# Patient Record
Sex: Female | Born: 1950
Health system: Southern US, Community
[De-identification: ages and names within clinical notes are randomized; demographics above are authoritative.]

## PROBLEM LIST (undated history)

## (undated) DIAGNOSIS — Z5189 Encounter for other specified aftercare: Secondary | ICD-10-CM

## (undated) DIAGNOSIS — B019 Varicella without complication: Secondary | ICD-10-CM

## (undated) DIAGNOSIS — K219 Gastro-esophageal reflux disease without esophagitis: Secondary | ICD-10-CM

## (undated) DIAGNOSIS — M810 Age-related osteoporosis without current pathological fracture: Secondary | ICD-10-CM

## (undated) DIAGNOSIS — Z8709 Personal history of other diseases of the respiratory system: Secondary | ICD-10-CM

## (undated) DIAGNOSIS — D649 Anemia, unspecified: Secondary | ICD-10-CM

## (undated) DIAGNOSIS — N390 Urinary tract infection, site not specified: Secondary | ICD-10-CM

## (undated) DIAGNOSIS — H9319 Tinnitus, unspecified ear: Secondary | ICD-10-CM

## (undated) DIAGNOSIS — M549 Dorsalgia, unspecified: Secondary | ICD-10-CM

## (undated) DIAGNOSIS — E039 Hypothyroidism, unspecified: Secondary | ICD-10-CM

## (undated) DIAGNOSIS — Z8711 Personal history of peptic ulcer disease: Secondary | ICD-10-CM

## (undated) DIAGNOSIS — G629 Polyneuropathy, unspecified: Secondary | ICD-10-CM

## (undated) DIAGNOSIS — M1990 Unspecified osteoarthritis, unspecified site: Secondary | ICD-10-CM

## (undated) DIAGNOSIS — G8929 Other chronic pain: Secondary | ICD-10-CM

## (undated) DIAGNOSIS — F329 Major depressive disorder, single episode, unspecified: Secondary | ICD-10-CM

## (undated) DIAGNOSIS — F411 Generalized anxiety disorder: Secondary | ICD-10-CM

## (undated) DIAGNOSIS — H409 Unspecified glaucoma: Secondary | ICD-10-CM

## (undated) HISTORY — DX: Dorsalgia, unspecified: M54.9

## (undated) HISTORY — DX: Personal history of other diseases of the respiratory system: Z87.09

## (undated) HISTORY — DX: Urinary tract infection, site not specified: N39.0

## (undated) HISTORY — DX: Tinnitus, unspecified ear: H93.19

## (undated) HISTORY — DX: Unspecified glaucoma: H40.9

## (undated) HISTORY — PX: OTHER SURGICAL HISTORY: SHX169

## (undated) HISTORY — DX: Unspecified osteoarthritis, unspecified site: M19.90

## (undated) HISTORY — DX: Major depressive disorder, single episode, unspecified: F32.9

## (undated) HISTORY — DX: Other chronic pain: G89.29

## (undated) HISTORY — DX: Varicella without complication: B01.9

## (undated) HISTORY — DX: Age-related osteoporosis without current pathological fracture: M81.0

## (undated) HISTORY — DX: Anemia, unspecified: D64.9

## (undated) HISTORY — DX: Personal history of peptic ulcer disease: Z87.11

## (undated) HISTORY — DX: Polyneuropathy, unspecified: G62.9

## (undated) HISTORY — DX: Generalized anxiety disorder: F41.1

## (undated) HISTORY — DX: Encounter for other specified aftercare: Z51.89

## (undated) HISTORY — DX: Hypothyroidism, unspecified: E03.9

## (undated) HISTORY — DX: Gastro-esophageal reflux disease without esophagitis: K21.9

---
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
## 1981-11-04 HISTORY — PX: ABDOMINAL HYSTERECTOMY: SHX81

## 1982-11-04 HISTORY — PX: OTHER SURGICAL HISTORY: SHX169

## 1994-11-05 LAB — HM COLONOSCOPY: HM Colonoscopy: NORMAL

## 1999-12-28 ENCOUNTER — Other Ambulatory Visit: Admission: RE | Admit: 1999-12-28 | Discharge: 1999-12-28 | Payer: Self-pay | Admitting: Obstetrics and Gynecology

## 2004-06-13 ENCOUNTER — Observation Stay (HOSPITAL_COMMUNITY): Admission: EM | Admit: 2004-06-13 | Discharge: 2004-06-14 | Payer: Self-pay | Admitting: Emergency Medicine

## 2004-10-02 ENCOUNTER — Ambulatory Visit: Payer: Self-pay | Admitting: Family Medicine

## 2004-11-04 HISTORY — PX: COLONOSCOPY: SHX174

## 2004-11-07 ENCOUNTER — Ambulatory Visit: Payer: Self-pay | Admitting: Family Medicine

## 2004-11-28 ENCOUNTER — Ambulatory Visit: Payer: Self-pay | Admitting: Family Medicine

## 2005-01-30 ENCOUNTER — Ambulatory Visit: Payer: Self-pay | Admitting: Family Medicine

## 2005-03-27 ENCOUNTER — Ambulatory Visit: Payer: Self-pay | Admitting: Family Medicine

## 2005-04-30 ENCOUNTER — Ambulatory Visit: Payer: Self-pay | Admitting: Family Medicine

## 2005-06-04 ENCOUNTER — Ambulatory Visit: Payer: Self-pay | Admitting: Family Medicine

## 2005-07-30 ENCOUNTER — Ambulatory Visit: Payer: Self-pay | Admitting: Family Medicine

## 2005-11-13 ENCOUNTER — Ambulatory Visit: Payer: Self-pay | Admitting: Family Medicine

## 2006-05-12 ENCOUNTER — Ambulatory Visit: Payer: Self-pay | Admitting: Family Medicine

## 2006-05-15 ENCOUNTER — Encounter (INDEPENDENT_AMBULATORY_CARE_PROVIDER_SITE_OTHER): Payer: Self-pay | Admitting: *Deleted

## 2006-05-15 ENCOUNTER — Ambulatory Visit: Payer: Self-pay | Admitting: Family Medicine

## 2006-06-05 ENCOUNTER — Emergency Department (HOSPITAL_COMMUNITY): Admission: EM | Admit: 2006-06-05 | Discharge: 2006-06-05 | Payer: Self-pay | Admitting: Emergency Medicine

## 2006-07-16 ENCOUNTER — Ambulatory Visit: Payer: Self-pay | Admitting: Family Medicine

## 2006-10-07 ENCOUNTER — Other Ambulatory Visit: Admission: RE | Admit: 2006-10-07 | Discharge: 2006-10-07 | Payer: Self-pay | Admitting: Family Medicine

## 2006-10-07 ENCOUNTER — Ambulatory Visit: Payer: Self-pay | Admitting: Family Medicine

## 2006-10-07 ENCOUNTER — Encounter: Payer: Self-pay | Admitting: Family Medicine

## 2006-10-07 LAB — CONVERTED CEMR LAB
Basophils Absolute: 0 10*3/uL (ref 0.0–0.1)
HCT: 42.8 % (ref 36.0–46.0)
MCHC: 33.4 g/dL (ref 30.0–36.0)
MCV: 96.6 fL (ref 78.0–100.0)
Neutrophils Relative %: 64.2 % (ref 43.0–77.0)
Platelets: 258 10*3/uL (ref 150–400)
RBC: 4.43 M/uL (ref 3.87–5.11)
RDW: 11.6 % (ref 11.5–14.6)
TSH: 0.74 microintl units/mL (ref 0.35–5.50)

## 2007-03-03 ENCOUNTER — Ambulatory Visit: Payer: Self-pay | Admitting: Family Medicine

## 2007-03-03 LAB — CONVERTED CEMR LAB
Alkaline Phosphatase: 98 units/L (ref 39–117)
BUN: 7 mg/dL (ref 6–23)
Basophils Relative: 0.2 % (ref 0.0–1.0)
Bilirubin, Direct: 0.1 mg/dL (ref 0.0–0.3)
CO2: 29 meq/L (ref 19–32)
Creatinine, Ser: 0.9 mg/dL (ref 0.4–1.2)
Eosinophils Relative: 1.7 % (ref 0.0–5.0)
GFR calc Af Amer: 83 mL/min
Glucose, Bld: 109 mg/dL — ABNORMAL HIGH (ref 70–99)
HCT: 45.7 % (ref 36.0–46.0)
Hemoglobin: 15.4 g/dL — ABNORMAL HIGH (ref 12.0–15.0)
Lymphocytes Relative: 27.5 % (ref 12.0–46.0)
Monocytes Absolute: 0.6 10*3/uL (ref 0.2–0.7)
Neutro Abs: 3.6 10*3/uL (ref 1.4–7.7)
Neutrophils Relative %: 60.6 % (ref 43.0–77.0)
Potassium: 3.6 meq/L (ref 3.5–5.1)
RDW: 12.2 % (ref 11.5–14.6)
Sodium: 142 meq/L (ref 135–145)
TSH: 0.02 microintl units/mL — ABNORMAL LOW (ref 0.35–5.50)
Total Bilirubin: 0.8 mg/dL (ref 0.3–1.2)
Total Protein: 7.5 g/dL (ref 6.0–8.3)
WBC: 5.9 10*3/uL (ref 4.5–10.5)

## 2007-03-04 ENCOUNTER — Encounter: Payer: Self-pay | Admitting: Family Medicine

## 2007-03-04 DIAGNOSIS — M1990 Unspecified osteoarthritis, unspecified site: Secondary | ICD-10-CM

## 2007-03-04 DIAGNOSIS — F411 Generalized anxiety disorder: Secondary | ICD-10-CM

## 2007-03-04 DIAGNOSIS — Z8709 Personal history of other diseases of the respiratory system: Secondary | ICD-10-CM

## 2007-03-04 DIAGNOSIS — R269 Unspecified abnormalities of gait and mobility: Secondary | ICD-10-CM

## 2007-03-04 DIAGNOSIS — F604 Histrionic personality disorder: Secondary | ICD-10-CM | POA: Insufficient documentation

## 2007-03-04 DIAGNOSIS — Z8711 Personal history of peptic ulcer disease: Secondary | ICD-10-CM

## 2007-03-04 DIAGNOSIS — R4589 Other symptoms and signs involving emotional state: Secondary | ICD-10-CM | POA: Insufficient documentation

## 2007-03-04 DIAGNOSIS — F32A Depression, unspecified: Secondary | ICD-10-CM

## 2007-03-04 DIAGNOSIS — F329 Major depressive disorder, single episode, unspecified: Secondary | ICD-10-CM

## 2007-03-04 DIAGNOSIS — G4700 Insomnia, unspecified: Secondary | ICD-10-CM

## 2007-03-04 HISTORY — DX: Generalized anxiety disorder: F41.1

## 2007-03-04 HISTORY — DX: Depression, unspecified: F32.A

## 2007-03-04 HISTORY — DX: Personal history of other diseases of the respiratory system: Z87.09

## 2007-03-04 HISTORY — DX: Personal history of peptic ulcer disease: Z87.11

## 2007-06-01 ENCOUNTER — Encounter: Payer: Self-pay | Admitting: Family Medicine

## 2007-07-14 ENCOUNTER — Telehealth: Payer: Self-pay | Admitting: Family Medicine

## 2007-08-12 ENCOUNTER — Telehealth (INDEPENDENT_AMBULATORY_CARE_PROVIDER_SITE_OTHER): Payer: Self-pay | Admitting: *Deleted

## 2007-08-31 ENCOUNTER — Encounter: Payer: Self-pay | Admitting: Family Medicine

## 2007-09-22 ENCOUNTER — Telehealth: Payer: Self-pay | Admitting: Family Medicine

## 2007-10-12 ENCOUNTER — Ambulatory Visit: Payer: Self-pay | Admitting: Family Medicine

## 2007-10-12 ENCOUNTER — Telehealth (INDEPENDENT_AMBULATORY_CARE_PROVIDER_SITE_OTHER): Payer: Self-pay | Admitting: *Deleted

## 2007-10-12 DIAGNOSIS — E7800 Pure hypercholesterolemia, unspecified: Secondary | ICD-10-CM

## 2007-10-20 ENCOUNTER — Ambulatory Visit: Payer: Self-pay | Admitting: Family Medicine

## 2007-10-20 DIAGNOSIS — G8929 Other chronic pain: Secondary | ICD-10-CM | POA: Insufficient documentation

## 2007-10-20 DIAGNOSIS — E039 Hypothyroidism, unspecified: Secondary | ICD-10-CM | POA: Insufficient documentation

## 2007-10-20 DIAGNOSIS — H1045 Other chronic allergic conjunctivitis: Secondary | ICD-10-CM

## 2007-10-20 DIAGNOSIS — M549 Dorsalgia, unspecified: Secondary | ICD-10-CM

## 2007-11-22 LAB — CONVERTED CEMR LAB
BUN: 9 mg/dL (ref 6–23)
CO2: 30 meq/L (ref 19–32)
Calcium: 8.9 mg/dL (ref 8.4–10.5)
Chloride: 102 meq/L (ref 96–112)
GFR calc Af Amer: 111 mL/min
GFR calc non Af Amer: 91 mL/min
TSH: 0.26 microintl units/mL — ABNORMAL LOW (ref 0.35–5.50)
Total CHOL/HDL Ratio: 4.7
Triglycerides: 209 mg/dL (ref 0–149)

## 2007-12-23 ENCOUNTER — Telehealth: Payer: Self-pay | Admitting: Family Medicine

## 2008-01-20 ENCOUNTER — Ambulatory Visit: Payer: Self-pay | Admitting: Family Medicine

## 2008-01-20 DIAGNOSIS — J209 Acute bronchitis, unspecified: Secondary | ICD-10-CM | POA: Insufficient documentation

## 2008-01-20 DIAGNOSIS — E785 Hyperlipidemia, unspecified: Secondary | ICD-10-CM

## 2008-01-20 DIAGNOSIS — D649 Anemia, unspecified: Secondary | ICD-10-CM | POA: Insufficient documentation

## 2008-01-20 HISTORY — DX: Anemia, unspecified: D64.9

## 2008-01-26 LAB — CONVERTED CEMR LAB
Albumin: 3.8 g/dL (ref 3.5–5.2)
Basophils Absolute: 0 10*3/uL (ref 0.0–0.1)
Cholesterol: 199 mg/dL (ref 0–200)
Eosinophils Absolute: 0.1 10*3/uL (ref 0.0–0.6)
GFR calc Af Amer: 111 mL/min
GFR calc non Af Amer: 91 mL/min
HCT: 41.1 % (ref 36.0–46.0)
HDL: 43.1 mg/dL (ref >39.0)
Hemoglobin: 13.7 g/dL (ref 12.0–15.0)
MCHC: 33.3 g/dL (ref 30.0–36.0)
MCV: 92.1 fL (ref 78.0–100.0)
Monocytes Absolute: 0.5 10*3/uL (ref 0.2–0.7)
Neutro Abs: 4.2 10*3/uL (ref 1.4–7.7)
Neutrophils Relative %: 65.2 % (ref 43.0–77.0)
Potassium: 4.5 meq/L (ref 3.5–5.1)
Sodium: 142 meq/L (ref 135–145)
TSH: 0.07 microintl units/mL — ABNORMAL LOW (ref 0.35–5.50)
Total Bilirubin: 0.8 mg/dL (ref 0.3–1.2)

## 2008-01-27 ENCOUNTER — Telehealth: Payer: Self-pay | Admitting: Family Medicine

## 2008-04-22 ENCOUNTER — Telehealth: Payer: Self-pay | Admitting: Family Medicine

## 2008-04-29 ENCOUNTER — Telehealth: Payer: Self-pay | Admitting: Family Medicine

## 2008-04-29 ENCOUNTER — Ambulatory Visit: Payer: Self-pay | Admitting: Family Medicine

## 2008-04-29 DIAGNOSIS — R5383 Other fatigue: Secondary | ICD-10-CM

## 2008-04-29 DIAGNOSIS — R5381 Other malaise: Secondary | ICD-10-CM

## 2008-05-11 ENCOUNTER — Encounter: Payer: Self-pay | Admitting: Family Medicine

## 2008-10-26 ENCOUNTER — Ambulatory Visit: Payer: Self-pay | Admitting: Family Medicine

## 2008-10-26 DIAGNOSIS — N951 Menopausal and female climacteric states: Secondary | ICD-10-CM

## 2008-10-30 LAB — CONVERTED CEMR LAB: TSH: 0.15 microintl units/mL — ABNORMAL LOW (ref 0.35–5.50)

## 2009-01-17 ENCOUNTER — Telehealth: Payer: Self-pay | Admitting: Family Medicine

## 2009-03-01 ENCOUNTER — Telehealth: Payer: Self-pay | Admitting: Family Medicine

## 2009-11-04 HISTORY — PX: CHOLECYSTECTOMY: SHX55

## 2010-09-26 LAB — PULMONARY FUNCTION TEST

## 2011-03-22 NOTE — H&P (Signed)
Angel Ryan, Angel Ryan                          ACCOUNT NO.:  [id number]   MEDICAL RECORD NO.:  [id number]                   PATIENT TYPE:  EMS   LOCATION:  ED                                   FACILITY:  [facility]   PHYSICIAN:  Tera Mater. Clent Ridges, M.D. [facility]            DATE OF BIRTH:  1951-03-05   DATE OF ADMISSION:  06/13/2004  DATE OF DISCHARGE:                                HISTORY & PHYSICAL   CHIEF COMPLAINT:  This is a 60 year old woman with sudden onset of mental  status changes alternating between agitation and somnolence after taking her  first dose of Cymbalta earlier this morning.   HISTORY OF PRESENT ILLNESS:  The patient has a long history of depression  and anxiety.  She had been on Zoloft for quite a while and had been up to a  dose of 150 mg per day.  She tolerated it quite well, but it was felt that a  switch to an agent like Cymbalta may help better with her chronic pain.  She  does have chronic severe lower back and left hip pain.  She is status post a  total left hip replacement, but continues to have severe pain in this area.  She had been seen by neurosurgery who felt they had nothing to offer.  I  believe she has an appointment to see Dr. Ollen Gross about it coming up  some time soon.  She has been on chronic daily narcotics for some time.  At  any event, she was slowly tapered down on Zoloft over a period of about six  weeks, until she took her final  25 mg tablet of Zoloft yesterday.  This morning, she took her first 30 mg  tablet of Cymbalta about 9 a.m.  About two hours later, she had the fairly  rapid onset of somnolence, of feeling dizzy or lightheaded, feeling shaky  and feeling chilled.  This progressively got worse.  Her husband was able to  transport her in the family car to our office when they were brought back  from the front urgently.  She has been on no other new medications.  Of  note, she was seen in the clinic on Mar 08, 2004, with complaints of  some  night-time urinary incontinence.  She had no pain at that time, no burning,  no fever, etc.  An urine culture was obtained that we were able to retrieve  from the computer this afternoon.  This was positive for growing E. coli  with a list of sensitivities that is included in the chart.   PAST MEDICAL HISTORY:  Besides what is listed above includes:  1. GE reflux disease.  2. Status post a L5-S1 laminectomy and microdiskectomy.   CURRENT MEDICATIONS:  1. Nexium 40 mg daily.  2. Alprazolam 1 mg t.i.d. or q.i.d.  3. Vicodin 10/325 mg four to six times per day.  4. Parafon  Forte t.i.d. p.r.n.  5. Tylenol p.r.n.  6. She was in the midst of changing from Zoloft to Cymbalta as listed above.   ALLERGIES:  None known until her reaction today.   SOCIAL HISTORY:  She is married.  She does not work.   FAMILY HISTORY:  Noncontributory.   HABITS:  She does not use tobacco or alcohol.   PHYSICAL EXAMINATION:  VITAL SIGNS:  Temperature axillary is 97, pulse  varied from 72 to 120 and was regular, respirations varied from 14 to 30 and  were non labored, although at times she was clearly hyperventilating, blood  pressure was stable around 120/80, room air oxygen saturations were 99%.  GENERAL:  At the beginning of my examination the patient was quite somnolent  and almost non-responsive to verbal stimulations.  Pupils equal, round,  reactive to light and accommodation.  She was able to follow some simple  commands if spoken to very loudly.  She was assisted on to a table.  However, at various parts of our examination she became extremely anxious  and agitated.  She would begin yelling loudly, objecting to the fact that we  planned to admit her to the hospital.  At times, she was hyperventilating so  much that she began to complain of numbness and tingling in her hands and  feet.  With simple re-breathing into a paper bag and reassurance, these  episodes were stopped fairly quickly.   Throughout my entire examination, she  complained of constant pain in the left hip and screamed loudly complaining  of pain with even the slightest movement of her left leg.  SKIN:  Free of significant lesions.  HEENT:  Eyes are grossly clear.  Ears are clear.  Oropharynx clear.  NECK:  Supple without lymphadenopathy or masses.  LUNGS:  Clear.  CARDIAC:  Regular rate and rhythm without gallops, murmurs, rubs.  Distal  pulses are full.  ABDOMEN:  Soft, normal bowel sounds, nontender, no masses.  EXTREMITIES:  No cyanosis, clubbing, or edema.  NEUROLOGIC:  Extremely limited due to her lack of participation.  Pupillary  reactions were normal as above.  She was able to move all four extremities.   ASSESSMENT AND PLAN:  This is a 60 year old woman with acute mental status  changes which could be classified as delirium, probably in part due to her  first dose of Cymbalta which was a new medication for her earlier this  morning.  She does have culture evidence of an urinary tract infection which  may be playing a role as well.  I believe she also has a tremendous  psychiatric overlay to these problems and was at times quite histrionic  during our examination today.  I do believe she needs to be closely  monitored and thus will be admitted to a monitored bed.  No beds are  immediately available, so she was transported via EMS to [facility]  Emergency Room.  We will obtain the usual laboratories, including an urine  drug screen and an EKG.  I spoke with Dr. Rene Paci who will see her  in the emergency room.  Certainly, antibiotic treatment of her urinary tract  infection will be initiated.  Psychiatric consultation may be advisable as  well.  Tera Mater. Clent Ridges, M.D. [facility]    SAF/MEDQ  D:  06/13/2004  T:  06/13/2004  Job:  [id number]

## 2011-10-19 DIAGNOSIS — R269 Unspecified abnormalities of gait and mobility: Secondary | ICD-10-CM | POA: Diagnosis not present

## 2011-10-19 DIAGNOSIS — G8929 Other chronic pain: Secondary | ICD-10-CM | POA: Diagnosis not present

## 2011-10-19 DIAGNOSIS — R262 Difficulty in walking, not elsewhere classified: Secondary | ICD-10-CM | POA: Diagnosis not present

## 2011-10-19 DIAGNOSIS — F411 Generalized anxiety disorder: Secondary | ICD-10-CM | POA: Diagnosis not present

## 2011-10-19 DIAGNOSIS — Z471 Aftercare following joint replacement surgery: Secondary | ICD-10-CM | POA: Diagnosis not present

## 2011-10-19 DIAGNOSIS — Z96649 Presence of unspecified artificial hip joint: Secondary | ICD-10-CM | POA: Diagnosis not present

## 2011-10-21 DIAGNOSIS — F411 Generalized anxiety disorder: Secondary | ICD-10-CM | POA: Diagnosis not present

## 2011-10-21 DIAGNOSIS — R269 Unspecified abnormalities of gait and mobility: Secondary | ICD-10-CM | POA: Diagnosis not present

## 2011-10-21 DIAGNOSIS — G8929 Other chronic pain: Secondary | ICD-10-CM | POA: Diagnosis not present

## 2011-10-21 DIAGNOSIS — R262 Difficulty in walking, not elsewhere classified: Secondary | ICD-10-CM | POA: Diagnosis not present

## 2011-10-21 DIAGNOSIS — Z96649 Presence of unspecified artificial hip joint: Secondary | ICD-10-CM | POA: Diagnosis not present

## 2011-10-21 DIAGNOSIS — Z471 Aftercare following joint replacement surgery: Secondary | ICD-10-CM | POA: Diagnosis not present

## 2011-10-24 DIAGNOSIS — Z471 Aftercare following joint replacement surgery: Secondary | ICD-10-CM | POA: Diagnosis not present

## 2011-10-24 DIAGNOSIS — G8929 Other chronic pain: Secondary | ICD-10-CM | POA: Diagnosis not present

## 2011-10-24 DIAGNOSIS — F411 Generalized anxiety disorder: Secondary | ICD-10-CM | POA: Diagnosis not present

## 2011-10-24 DIAGNOSIS — R262 Difficulty in walking, not elsewhere classified: Secondary | ICD-10-CM | POA: Diagnosis not present

## 2011-10-24 DIAGNOSIS — Z96649 Presence of unspecified artificial hip joint: Secondary | ICD-10-CM | POA: Diagnosis not present

## 2011-10-24 DIAGNOSIS — R269 Unspecified abnormalities of gait and mobility: Secondary | ICD-10-CM | POA: Diagnosis not present

## 2011-10-25 DIAGNOSIS — Z96649 Presence of unspecified artificial hip joint: Secondary | ICD-10-CM | POA: Diagnosis not present

## 2011-10-25 DIAGNOSIS — R269 Unspecified abnormalities of gait and mobility: Secondary | ICD-10-CM | POA: Diagnosis not present

## 2011-10-25 DIAGNOSIS — F411 Generalized anxiety disorder: Secondary | ICD-10-CM | POA: Diagnosis not present

## 2011-10-25 DIAGNOSIS — G8929 Other chronic pain: Secondary | ICD-10-CM | POA: Diagnosis not present

## 2011-10-25 DIAGNOSIS — Z471 Aftercare following joint replacement surgery: Secondary | ICD-10-CM | POA: Diagnosis not present

## 2011-10-25 DIAGNOSIS — R262 Difficulty in walking, not elsewhere classified: Secondary | ICD-10-CM | POA: Diagnosis not present

## 2011-10-30 DIAGNOSIS — Z96649 Presence of unspecified artificial hip joint: Secondary | ICD-10-CM | POA: Diagnosis not present

## 2011-10-30 DIAGNOSIS — F411 Generalized anxiety disorder: Secondary | ICD-10-CM | POA: Diagnosis not present

## 2011-10-30 DIAGNOSIS — G8929 Other chronic pain: Secondary | ICD-10-CM | POA: Diagnosis not present

## 2011-10-30 DIAGNOSIS — Z471 Aftercare following joint replacement surgery: Secondary | ICD-10-CM | POA: Diagnosis not present

## 2011-10-30 DIAGNOSIS — R262 Difficulty in walking, not elsewhere classified: Secondary | ICD-10-CM | POA: Diagnosis not present

## 2011-10-30 DIAGNOSIS — R269 Unspecified abnormalities of gait and mobility: Secondary | ICD-10-CM | POA: Diagnosis not present

## 2011-11-06 DIAGNOSIS — F411 Generalized anxiety disorder: Secondary | ICD-10-CM | POA: Diagnosis not present

## 2011-11-06 DIAGNOSIS — R269 Unspecified abnormalities of gait and mobility: Secondary | ICD-10-CM | POA: Diagnosis not present

## 2011-11-06 DIAGNOSIS — G8929 Other chronic pain: Secondary | ICD-10-CM | POA: Diagnosis not present

## 2011-11-06 DIAGNOSIS — Z96649 Presence of unspecified artificial hip joint: Secondary | ICD-10-CM | POA: Diagnosis not present

## 2011-11-06 DIAGNOSIS — Z471 Aftercare following joint replacement surgery: Secondary | ICD-10-CM | POA: Diagnosis not present

## 2011-11-06 DIAGNOSIS — R262 Difficulty in walking, not elsewhere classified: Secondary | ICD-10-CM | POA: Diagnosis not present

## 2011-11-19 DIAGNOSIS — Z96649 Presence of unspecified artificial hip joint: Secondary | ICD-10-CM | POA: Diagnosis not present

## 2012-06-23 DIAGNOSIS — S2341XA Sprain of ribs, initial encounter: Secondary | ICD-10-CM | POA: Diagnosis not present

## 2012-06-23 DIAGNOSIS — K219 Gastro-esophageal reflux disease without esophagitis: Secondary | ICD-10-CM | POA: Diagnosis not present

## 2012-06-23 DIAGNOSIS — H9319 Tinnitus, unspecified ear: Secondary | ICD-10-CM | POA: Diagnosis not present

## 2012-06-23 DIAGNOSIS — R300 Dysuria: Secondary | ICD-10-CM | POA: Diagnosis not present

## 2012-06-30 DIAGNOSIS — M545 Low back pain: Secondary | ICD-10-CM | POA: Diagnosis not present

## 2012-06-30 DIAGNOSIS — E559 Vitamin D deficiency, unspecified: Secondary | ICD-10-CM | POA: Diagnosis not present

## 2012-06-30 DIAGNOSIS — R7309 Other abnormal glucose: Secondary | ICD-10-CM | POA: Diagnosis not present

## 2012-06-30 DIAGNOSIS — D508 Other iron deficiency anemias: Secondary | ICD-10-CM | POA: Diagnosis not present

## 2012-06-30 DIAGNOSIS — R5381 Other malaise: Secondary | ICD-10-CM | POA: Diagnosis not present

## 2012-06-30 DIAGNOSIS — M949 Disorder of cartilage, unspecified: Secondary | ICD-10-CM | POA: Diagnosis not present

## 2012-06-30 DIAGNOSIS — M899 Disorder of bone, unspecified: Secondary | ICD-10-CM | POA: Diagnosis not present

## 2012-06-30 DIAGNOSIS — E8941 Symptomatic postprocedural ovarian failure: Secondary | ICD-10-CM | POA: Diagnosis not present

## 2012-06-30 DIAGNOSIS — M2550 Pain in unspecified joint: Secondary | ICD-10-CM | POA: Diagnosis not present

## 2012-06-30 DIAGNOSIS — Z79899 Other long term (current) drug therapy: Secondary | ICD-10-CM | POA: Diagnosis not present

## 2012-07-08 DIAGNOSIS — F411 Generalized anxiety disorder: Secondary | ICD-10-CM | POA: Diagnosis not present

## 2012-07-08 DIAGNOSIS — E039 Hypothyroidism, unspecified: Secondary | ICD-10-CM | POA: Diagnosis not present

## 2012-07-08 DIAGNOSIS — H9319 Tinnitus, unspecified ear: Secondary | ICD-10-CM | POA: Diagnosis not present

## 2012-07-08 DIAGNOSIS — E559 Vitamin D deficiency, unspecified: Secondary | ICD-10-CM | POA: Diagnosis not present

## 2012-07-10 DIAGNOSIS — R1013 Epigastric pain: Secondary | ICD-10-CM | POA: Diagnosis not present

## 2012-07-10 DIAGNOSIS — K3189 Other diseases of stomach and duodenum: Secondary | ICD-10-CM | POA: Diagnosis not present

## 2012-08-06 DIAGNOSIS — G4700 Insomnia, unspecified: Secondary | ICD-10-CM | POA: Diagnosis not present

## 2012-08-06 DIAGNOSIS — B009 Herpesviral infection, unspecified: Secondary | ICD-10-CM | POA: Diagnosis not present

## 2012-08-06 DIAGNOSIS — M25519 Pain in unspecified shoulder: Secondary | ICD-10-CM | POA: Diagnosis not present

## 2012-09-07 DIAGNOSIS — E039 Hypothyroidism, unspecified: Secondary | ICD-10-CM | POA: Diagnosis not present

## 2012-09-07 DIAGNOSIS — G4700 Insomnia, unspecified: Secondary | ICD-10-CM | POA: Diagnosis not present

## 2012-09-07 DIAGNOSIS — R079 Chest pain, unspecified: Secondary | ICD-10-CM | POA: Diagnosis not present

## 2012-09-07 DIAGNOSIS — S2341XA Sprain of ribs, initial encounter: Secondary | ICD-10-CM | POA: Diagnosis not present

## 2012-09-10 DIAGNOSIS — R109 Unspecified abdominal pain: Secondary | ICD-10-CM | POA: Diagnosis not present

## 2012-09-10 DIAGNOSIS — R079 Chest pain, unspecified: Secondary | ICD-10-CM | POA: Diagnosis not present

## 2012-09-28 DIAGNOSIS — R079 Chest pain, unspecified: Secondary | ICD-10-CM | POA: Diagnosis not present

## 2012-11-02 DIAGNOSIS — E039 Hypothyroidism, unspecified: Secondary | ICD-10-CM | POA: Diagnosis not present

## 2012-11-02 DIAGNOSIS — E559 Vitamin D deficiency, unspecified: Secondary | ICD-10-CM | POA: Diagnosis not present

## 2013-01-19 DIAGNOSIS — Z96649 Presence of unspecified artificial hip joint: Secondary | ICD-10-CM | POA: Diagnosis not present

## 2013-01-19 DIAGNOSIS — M545 Low back pain: Secondary | ICD-10-CM | POA: Diagnosis not present

## 2013-01-19 DIAGNOSIS — M169 Osteoarthritis of hip, unspecified: Secondary | ICD-10-CM | POA: Diagnosis not present

## 2013-01-19 DIAGNOSIS — M79609 Pain in unspecified limb: Secondary | ICD-10-CM | POA: Diagnosis not present

## 2013-01-21 DIAGNOSIS — M5126 Other intervertebral disc displacement, lumbar region: Secondary | ICD-10-CM | POA: Diagnosis not present

## 2013-01-21 DIAGNOSIS — M5137 Other intervertebral disc degeneration, lumbosacral region: Secondary | ICD-10-CM | POA: Diagnosis not present

## 2013-01-21 DIAGNOSIS — M545 Low back pain: Secondary | ICD-10-CM | POA: Diagnosis not present

## 2013-01-26 DIAGNOSIS — IMO0002 Reserved for concepts with insufficient information to code with codable children: Secondary | ICD-10-CM | POA: Diagnosis not present

## 2013-01-26 DIAGNOSIS — M25559 Pain in unspecified hip: Secondary | ICD-10-CM | POA: Diagnosis not present

## 2013-01-26 DIAGNOSIS — M545 Low back pain: Secondary | ICD-10-CM | POA: Diagnosis not present

## 2013-01-26 DIAGNOSIS — Z96649 Presence of unspecified artificial hip joint: Secondary | ICD-10-CM | POA: Diagnosis not present

## 2013-02-10 DIAGNOSIS — E039 Hypothyroidism, unspecified: Secondary | ICD-10-CM | POA: Diagnosis not present

## 2013-02-10 DIAGNOSIS — M1990 Unspecified osteoarthritis, unspecified site: Secondary | ICD-10-CM | POA: Diagnosis not present

## 2013-02-10 DIAGNOSIS — E559 Vitamin D deficiency, unspecified: Secondary | ICD-10-CM | POA: Diagnosis not present

## 2013-02-17 DIAGNOSIS — Z1388 Encounter for screening for disorder due to exposure to contaminants: Secondary | ICD-10-CM | POA: Diagnosis not present

## 2013-05-04 DIAGNOSIS — M1990 Unspecified osteoarthritis, unspecified site: Secondary | ICD-10-CM | POA: Diagnosis not present

## 2013-05-04 DIAGNOSIS — E039 Hypothyroidism, unspecified: Secondary | ICD-10-CM | POA: Diagnosis not present

## 2013-06-28 DIAGNOSIS — M549 Dorsalgia, unspecified: Secondary | ICD-10-CM | POA: Diagnosis not present

## 2013-06-28 DIAGNOSIS — E785 Hyperlipidemia, unspecified: Secondary | ICD-10-CM | POA: Diagnosis not present

## 2013-06-28 DIAGNOSIS — I2589 Other forms of chronic ischemic heart disease: Secondary | ICD-10-CM | POA: Diagnosis not present

## 2013-06-28 DIAGNOSIS — I10 Essential (primary) hypertension: Secondary | ICD-10-CM | POA: Diagnosis not present

## 2013-09-20 DIAGNOSIS — E039 Hypothyroidism, unspecified: Secondary | ICD-10-CM | POA: Diagnosis not present

## 2013-09-20 DIAGNOSIS — G589 Mononeuropathy, unspecified: Secondary | ICD-10-CM | POA: Diagnosis not present

## 2013-09-20 DIAGNOSIS — M810 Age-related osteoporosis without current pathological fracture: Secondary | ICD-10-CM | POA: Diagnosis not present

## 2013-09-20 DIAGNOSIS — E559 Vitamin D deficiency, unspecified: Secondary | ICD-10-CM | POA: Diagnosis not present

## 2013-09-20 DIAGNOSIS — F411 Generalized anxiety disorder: Secondary | ICD-10-CM | POA: Diagnosis not present

## 2013-09-20 DIAGNOSIS — M5137 Other intervertebral disc degeneration, lumbosacral region: Secondary | ICD-10-CM | POA: Diagnosis not present

## 2013-12-27 DIAGNOSIS — F411 Generalized anxiety disorder: Secondary | ICD-10-CM | POA: Diagnosis not present

## 2013-12-27 DIAGNOSIS — M47817 Spondylosis without myelopathy or radiculopathy, lumbosacral region: Secondary | ICD-10-CM | POA: Diagnosis not present

## 2013-12-27 DIAGNOSIS — G589 Mononeuropathy, unspecified: Secondary | ICD-10-CM | POA: Diagnosis not present

## 2013-12-27 DIAGNOSIS — E559 Vitamin D deficiency, unspecified: Secondary | ICD-10-CM | POA: Diagnosis not present

## 2013-12-27 DIAGNOSIS — E039 Hypothyroidism, unspecified: Secondary | ICD-10-CM | POA: Diagnosis not present

## 2014-04-12 DIAGNOSIS — G589 Mononeuropathy, unspecified: Secondary | ICD-10-CM | POA: Diagnosis not present

## 2014-04-12 DIAGNOSIS — M47817 Spondylosis without myelopathy or radiculopathy, lumbosacral region: Secondary | ICD-10-CM | POA: Diagnosis not present

## 2014-04-12 DIAGNOSIS — E559 Vitamin D deficiency, unspecified: Secondary | ICD-10-CM | POA: Diagnosis not present

## 2014-04-12 DIAGNOSIS — Z23 Encounter for immunization: Secondary | ICD-10-CM | POA: Diagnosis not present

## 2014-04-12 DIAGNOSIS — F411 Generalized anxiety disorder: Secondary | ICD-10-CM | POA: Diagnosis not present

## 2014-04-12 DIAGNOSIS — E039 Hypothyroidism, unspecified: Secondary | ICD-10-CM | POA: Diagnosis not present

## 2014-04-13 DIAGNOSIS — M25559 Pain in unspecified hip: Secondary | ICD-10-CM | POA: Diagnosis not present

## 2015-01-05 DIAGNOSIS — H4011X1 Primary open-angle glaucoma, mild stage: Secondary | ICD-10-CM | POA: Diagnosis not present

## 2015-01-05 DIAGNOSIS — H18412 Arcus senilis, left eye: Secondary | ICD-10-CM | POA: Diagnosis not present

## 2015-01-05 DIAGNOSIS — H18411 Arcus senilis, right eye: Secondary | ICD-10-CM | POA: Diagnosis not present

## 2015-01-05 DIAGNOSIS — H527 Unspecified disorder of refraction: Secondary | ICD-10-CM | POA: Diagnosis not present

## 2015-01-23 ENCOUNTER — Encounter: Payer: Self-pay | Admitting: Family Medicine

## 2015-01-23 ENCOUNTER — Ambulatory Visit (INDEPENDENT_AMBULATORY_CARE_PROVIDER_SITE_OTHER): Payer: Medicare Other | Admitting: Family Medicine

## 2015-01-23 ENCOUNTER — Other Ambulatory Visit: Payer: Self-pay

## 2015-01-23 ENCOUNTER — Telehealth: Payer: Self-pay | Admitting: Family Medicine

## 2015-01-23 VITALS — BP 122/74 | HR 69 | Temp 98.2°F | Ht 64.0 in | Wt 146.0 lb

## 2015-01-23 DIAGNOSIS — E785 Hyperlipidemia, unspecified: Secondary | ICD-10-CM | POA: Diagnosis not present

## 2015-01-23 DIAGNOSIS — Z23 Encounter for immunization: Secondary | ICD-10-CM | POA: Diagnosis not present

## 2015-01-23 DIAGNOSIS — N644 Mastodynia: Secondary | ICD-10-CM

## 2015-01-23 DIAGNOSIS — G8929 Other chronic pain: Secondary | ICD-10-CM

## 2015-01-23 DIAGNOSIS — E039 Hypothyroidism, unspecified: Secondary | ICD-10-CM

## 2015-01-23 DIAGNOSIS — H409 Unspecified glaucoma: Secondary | ICD-10-CM | POA: Insufficient documentation

## 2015-01-23 DIAGNOSIS — Z1231 Encounter for screening mammogram for malignant neoplasm of breast: Secondary | ICD-10-CM

## 2015-01-23 DIAGNOSIS — Z1211 Encounter for screening for malignant neoplasm of colon: Secondary | ICD-10-CM | POA: Diagnosis not present

## 2015-01-23 DIAGNOSIS — R1013 Epigastric pain: Secondary | ICD-10-CM | POA: Diagnosis not present

## 2015-01-23 DIAGNOSIS — E559 Vitamin D deficiency, unspecified: Secondary | ICD-10-CM | POA: Insufficient documentation

## 2015-01-23 MED ORDER — LEVOTHYROXINE SODIUM 75 MCG PO TABS: 75.0000 ug | ORAL_TABLET | Freq: Every day | ORAL | Status: DC

## 2015-01-23 NOTE — Assessment & Plan Note (Signed)
S: previously controlled on levothyroxine 75 mcg A/P: check TSH. Refilled levothyroxine

## 2015-01-23 NOTE — Progress Notes (Signed)
Angel Reddish, MD Phone: [phone]  Subjective:  Patient presents today to establish care as new patient. Formerly [facility] and prior to that in [place], prior to that Dr. Joni Fears at [place] complaint-noted.   See problem oriented charting ROS-No hair or nail changes. No heat/cold intolerance. No constipation or diarrhea. Denies shakiness or anxiety. No palpitations. No chest pain or shortness of breath. No myalgias. No leg weakness. No falls since being off vicodin, lyrica, xanax. Mild nausea at timse. No vomiting. No diarrhea/constipation. No unintentional weight loss. No worsening fatigue. No night sweats. No dysuria/polyuria/nocturia.   The following were reviewed and entered/updated in epic: Past Medical History  Diagnosis Date  . Hypothyroidism   . Osteoporosis   . Chronic back pain   . Neuropathy   . GERD (gastroesophageal reflux disease)   . Glaucoma     optho-xalatan  . Arthritis   . Blood transfusion without reported diagnosis   . Tinnitus   . UTI (urinary tract infection)   . Anemia 01/20/2008    Transfused in past after hip replacement    . ANXIETY DISORDER, GENERALIZED 03/04/2007    States resolved with prayer  , previously cymbalta, zoloft, prozac-bad SE. xanax only one that worked.   . Depression 03/04/2007    States resolved with prayer    . PNEUMONIA, HX OF 03/04/2007  . History of peptic ulcer disease 03/04/2007    Resolved on PPI, no longer on   . Chicken pox    Patient Active Problem List   Diagnosis Date Noted  . Abdominal pain, chronic, epigastric 01/23/2015    Priority: High  . Hyperlipidemia 01/20/2008    Priority: Medium  . Hypothyroidism 10/20/2007    Priority: Medium  . Chronic back pain 10/20/2007    Priority: Medium  . Vitamin D deficiency 01/23/2015    Priority: Low  . Glaucoma     Priority: Low  . Osteoarthritis 03/04/2007    Priority: Low  . 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 Abdominal hysterectomy  1983  . Tonsillectomy  1960  . Cesarean section  1980 and 1983  . Cholecystectomy  2011  . Hip replacement      L 1999, revised 2010; R 2010, revised 2012  . Bronchosc      related to pneumonia abscess  . Colonoscopy  2006  . Herniated disc surgery  1984    microdiskectomy    Family History  Problem Relation Age of Onset  . Arthritis Mother   . Diabetes Mother   . COPD Mother   . Heart attack Father     age 16  . Bipolar disorder Father   . Other Father     pacemaker  . Diabetes Father   . Renal Disease Mother     ESRD  . Breast cancer Maternal Grandmother   . Epilepsy Son   . Other Sister     liver transplant unknown cause  . Hypertension Mother   . Hypertension Father     Medications- reviewed and updated Current Outpatient Prescriptions  Medication Sig Dispense Refill  . latanoprost (XALATAN) 0.005 % ophthalmic solution 1 drop at bedtime.    Marland Kitchen levothyroxine (SYNTHROID, LEVOTHROID) 75 MCG tablet Take 1 tablet (75 mcg total) by mouth daily before breakfast. 30 tablet 5   No current facility-administered medications for this visit.    Allergies-reviewed and updated Allergies  Allergen Reactions  . Chromium  R hip pain-included in hip replacement material  . Cobalt     R hip pain-included in hip replacement material  . Duloxetine     From [facility] records-"passed out, hyperventilated, shaking"  . Hydrochlorothiazide W-Triamterene     REACTION: shortness of breath  . Latex     From [facility] records "Swell up, breathing issues"  . Nickel     From [facility] records-"blisters"  . Penicillins     From [facility] records-"blisters all over body"    History   Social History  . Marital Status: Married    Spouse Name: N/A  . Number of Children: N/A  . Years of Education: N/A   Occupational History  . disabled    Social History Main Topics  . Smoking status: Never Smoker   . Smokeless tobacco: Not on  file  . Alcohol Use: No  . Drug Use: No  . Sexual Activity: Not on file   Other Topics Concern  . None   Social History Narrative   Married. 2 kids, 1 in [place], 2 grandkids.       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 Oropharynx normal. TM normal. Eyes: sclera and lids normal, PERRLA Neck: no thyromegaly, no cervical lymphadenopathy CV: RRR no murmurs rubs or gallops Lungs: CTAB no crackles, wheeze, rhonchi Abdomen: soft/slight epigastric tenderness but distractable/nondistended/normal bowel sounds. No rebound or guarding.  Ext: no edema Skin: warm, dry, no rash over abdomen or mid back Neuro: 5/5 strength in upper and lower extremities, normal gait, normal reflexes  Assessment/Plan:  Hypothyroidism S: previously controlled on levothyroxine 75 mcg A/P: check TSH. Refilled levothyroxine    Hyperlipidemia S: May have been caused by hypothyroidism. Thinks this level improved when on levothyroxine but not sure of last #s A/P: check lipids today. 10 year risk calculation if LDL >100    Abdominal pain, chronic, epigastric S: upper abdominal pain, epigastric discomfort like a belt is tightening around her waist seems to be worse on the right. When lays down on that side feels like something is stuck in that area. Constant 5/10 pain and can wake from sleep especially if rolls onto side. Some [facility] notes-mention neuropathy down legs  Has had this discomfort since 2011. Has had abdominal u/s after cholecystectomy but no other abdominal imaging. Saw neurologist in [place]  at [facility] that did MRI of the brain to see if had a brain tumor due to right sided pain. Has had thoracic spine MRI through her PCP who she claims was her gastroenterologist yet never had updated colonoscopy.   In [place], had been cared for by Kathyrn Lass, MD of [place]. She states they primarily tried to treat her pain and she ended up having falls ([facility] notes indicate they were trying to wean patient off of medication or get her to psych and pain management on last visit). After last June 2015appointment, threw away lyrica, viodin, and xanax. Coming off of pain medication, pain did not improve or worsen off of medication.  Had been on gabapentin in past as well which did not help.  Most recent [facility] note 04/12/14 HPI: band across abdomen, seeing [name] tomorrow, 3-4 hydrocodone per day, 3-4 xanax per day. Lyrica 3 per day. Reports history EMG, NCV that were normal. Had 3 falls since last seen. Considered sending to pain management and psych. Cymbalta and zoloft had not worked in past. A/p: abdominal pain reported as neuropathy-decreased lyrica, f/u [name], reduce sedating medications after [name] assessment. Once again reports normal EMG, NCV per patient report (does not apper they obtained records) A -unclear etiology with 4 years of workup but minimal records available -from patient's perspective, prior providers thought this could be psych related. Dr. Joni Fears had histrionic personality in problem list when patient was last seen here many years ago. She does have many atypical features to her story.  -we discussed that we may never find an exact cause for this pain but my desire was to rule out more serious pathology that may lead to death. Discussed a cancer such as ovarian cancer could cause atypical symptoms but hopeful this is not related.  -could be radiation from a spinal issue but has never seen neurosurgery. ? Compression fracture with hx osteoporosis but has had imaging so  will wait to view. 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 MRI DJD 3/14)) -already with hysterectomy, cholecystectomy, appendectomy- not referred pain from these 3 organs -potential colon cancer? And not up to date on colonoscopy P: -obtain records from [place] to include MRI brain, abd u/s, MRI lumbar and thoracic spine. Workup supposedly from gastroenterology who was PCP.  - obtain basic labs today including ESR -discussed given chronic pain would want to obtain CT abdomen/pelvis unless prior workup leads me to different path -refer for colonoscopy     We will obtain records from abdominal pain workup from [place] which she states was all done by GI who was also her PCP, she also had MRI of thoracic spine. Obtain DEXA for hx osteoporosis. We will also obtain records from Dr. Norm Salt locally to see if he has evaluated provided input for this. Also will get basic labs as below. When all info collected, plan to reach out to patient. Asked her to reach out to [place] if had not heard within a month.   Encouraged mammogram-gave breast center handout  Future fasting labs Orders Placed This Encounter  Procedures  . Pneumococcal polysaccharide vaccine 23-valent greater than or equal to 2yo subcutaneous/IM  . Comprehensive metabolic panel    [place]    Standing Status: Future     Number of Occurrences:      Standing Expiration Date: 01/23/2016    Order Specific Question:  Has the patient fasted?    Answer:  No  . Lipid panel    [place]    Standing Status: Future     Number of Occurrences:      Standing Expiration Date: 01/23/2016    Order Specific Question:  Has the patient fasted?    Answer:  No  . TSH    [place]    Standing Status: Future     Number of Occurrences:      Standing Expiration Date: 01/23/2016  . Sedimentation rate        Standing Status: Future     Number of Occurrences:      Standing Expiration Date: 01/23/2016  . CBC with Differential/Platelet    Standing  Status: Future     Number of Occurrences:  Standing Expiration Date: 01/23/2016  . Ambulatory referral to Gastroenterology    Referral Priority:  Routine    Referral Type:  Consultation    Referral Reason:  Specialty Services Required    Requested Specialty:  Gastroenterology    Number of Visits Requested:  1    Meds ordered this encounter  Medications  . latanoprost (XALATAN) 0.005 % ophthalmic solution    Sig: 1 drop at bedtime.  Marland Kitchen levothyroxine (SYNTHROID, LEVOTHROID) 75 MCG tablet    Sig: Take 1 tablet (75 mcg total) by mouth daily before breakfast.    Dispense:  30 tablet    Refill:  5   >50% of 65 minute office visit was spent on counseling (patient dealing with stress of undiagnosed illness, feeling that she was not appropriately worked up in past) and coordination of care (review of records-old chart from [facility] as well as paper chart from [place], discussions to obtain records)

## 2015-01-23 NOTE — Telephone Encounter (Signed)
Pt needs diagnostic mammogram . Pt is having pain under arm pit and sides of her breasts. Breast center [phone]

## 2015-01-23 NOTE — Assessment & Plan Note (Addendum)
S: upper abdominal pain, epigastric discomfort like a belt is tightening around her waist seems to be worse on the right. When lays down on that side feels like something is stuck in that area. Constant 5/10 pain and can wake from sleep especially if rolls onto side. Some [facility] notes-mention neuropathy down legs  Has had this discomfort since 2011. Has had abdominal u/s after cholecystectomy but no other abdominal imaging. Saw neurologist in [place] at [facility] that did MRI of the brain to see if had a brain tumor due to right sided pain. Has had thoracic spine MRI through her PCP who she claims was her gastroenterologist yet never had updated colonoscopy.   In [place], had been cared for by Angel Lass, MD of [place]. She states they primarily tried to treat her pain and she ended up having falls ([facility] notes indicate they were trying to wean patient off of medication or get her to psych and pain management on last visit). After last June 2015appointment, threw away lyrica, viodin, and xanax. Coming off of pain medication, pain did not improve or worsen off of medication. Had been on gabapentin in past as well which did not help.  Most recent [facility] note 04/12/14 HPI: band across abdomen, seeing Angel Ryan tomorrow, 3-4 hydrocodone per day, 3-4 xanax per day. Lyrica 3 per day. Reports history EMG, NCV that were normal. Had 3 falls since last seen. Considered sending to pain management and psych. Cymbalta and zoloft had not worked in past. A/p: abdominal pain reported as neuropathy-decreased lyrica, f/u Angel Ryan, reduce sedating medications after Angel Ryan assessment. Once again reports normal EMG, NCV per patient report (does not apper they obtained records) A -unclear etiology with 4 years of workup but minimal records available -from patient's perspective, prior providers thought this could be psych related. Dr. Joni Ryan had histrionic personality in problem list when patient was last seen here many  years ago. She does have many atypical features to her story.  -we discussed that we may never find an exact cause for this pain but my desire was to rule out more serious pathology that may lead to death. Discussed a cancer such as ovarian cancer could cause atypical symptoms but hopeful this is not related.  -could be radiation from a spinal issue but has never seen neurosurgery. ? Compression fracture with hx osteoporosis but has had imaging so will wait to view. 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 MRI DJD 3/14)) -already with hysterectomy, cholecystectomy, appendectomy- not referred pain from these 3 organs -potential colon cancer? And not up to date on colonoscopy P: -obtain records from [place] to include MRI brain, abd u/s, MRI lumbar and thoracic spine. Workup supposedly from gastroenterology who was PCP.  - obtain basic labs today including ESR -discussed given chronic pain would want to obtain CT abdomen/pelvis unless prior workup leads me to different path -refer for colonoscopy

## 2015-01-23 NOTE — Telephone Encounter (Signed)
Mammogram referral entered

## 2015-01-23 NOTE — Patient Instructions (Addendum)
Contact inusrance company regarding Zostavax.  Received final Pneumonia shot today (Pneumovax23).  Sign release of information at the front desk for records from [place] for person investigating your abdominal pain. Records for bone density. Records for Dr. Fara Chutealdorff.   Schedule visit for future fasting labs.   We will refer you for colonoscopy  I likely will order CT abdomen/pelvis to further evaluate your pain once we get all information from previous workup.   Refilled levothyroxine

## 2015-01-23 NOTE — Assessment & Plan Note (Signed)
S: May have been caused by hypothyroidism. Thinks this level improved when on levothyroxine but not sure of last #s A/P: check lipids today. 10 year risk calculation if LDL >100

## 2015-01-24 ENCOUNTER — Other Ambulatory Visit (INDEPENDENT_AMBULATORY_CARE_PROVIDER_SITE_OTHER): Payer: Medicare Other

## 2015-01-24 ENCOUNTER — Encounter: Payer: Self-pay | Admitting: Family Medicine

## 2015-01-24 ENCOUNTER — Telehealth: Payer: Self-pay | Admitting: Family Medicine

## 2015-01-24 ENCOUNTER — Telehealth: Payer: Self-pay

## 2015-01-24 DIAGNOSIS — E785 Hyperlipidemia, unspecified: Secondary | ICD-10-CM | POA: Diagnosis not present

## 2015-01-24 DIAGNOSIS — G8929 Other chronic pain: Secondary | ICD-10-CM

## 2015-01-24 DIAGNOSIS — R1013 Epigastric pain: Secondary | ICD-10-CM | POA: Diagnosis not present

## 2015-01-24 DIAGNOSIS — E039 Hypothyroidism, unspecified: Secondary | ICD-10-CM

## 2015-01-24 LAB — CBC WITH DIFFERENTIAL/PLATELET
BASOS PCT: 0.5 % (ref 0.0–3.0)
Basophils Absolute: 0 10*3/uL (ref 0.0–0.1)
EOS ABS: 0.1 10*3/uL (ref 0.0–0.7)
Eosinophils Relative: 1.6 % (ref 0.0–5.0)
HCT: 44.7 % (ref 36.0–46.0)
HEMOGLOBIN: 15 g/dL (ref 12.0–15.0)
LYMPHS PCT: 26.2 % (ref 12.0–46.0)
Lymphs Abs: 1.2 10*3/uL (ref 0.7–4.0)
MCHC: 33.5 g/dL (ref 30.0–36.0)
MCV: 92.9 fl (ref 78.0–100.0)
Monocytes Absolute: 0.4 10*3/uL (ref 0.1–1.0)
Monocytes Relative: 8.7 % (ref 3.0–12.0)
NEUTROS ABS: 3 10*3/uL (ref 1.4–7.7)
Neutrophils Relative %: 63 % (ref 43.0–77.0)
Platelets: 201 10*3/uL (ref 150.0–400.0)
RBC: 4.8 Mil/uL (ref 3.87–5.11)
RDW: 13.3 % (ref 11.5–15.5)
WBC: 4.7 10*3/uL (ref 4.0–10.5)

## 2015-01-24 LAB — COMPREHENSIVE METABOLIC PANEL
ALT: 9 U/L (ref 0–35)
AST: 17 U/L (ref 0–37)
Albumin: 4 g/dL (ref 3.5–5.2)
Alkaline Phosphatase: 67 U/L (ref 39–117)
BUN: 16 mg/dL (ref 6–23)
CALCIUM: 9.3 mg/dL (ref 8.4–10.5)
CHLORIDE: 106 meq/L (ref 96–112)
CO2: 31 meq/L (ref 19–32)
CREATININE: 0.88 mg/dL (ref 0.40–1.20)
GFR: 68.37 mL/min (ref >60.00)
Glucose, Bld: 106 mg/dL — ABNORMAL HIGH (ref 70–99)
Potassium: 4.7 mEq/L (ref 3.5–5.1)
Sodium: 140 mEq/L (ref 135–145)
Total Bilirubin: 0.8 mg/dL (ref 0.2–1.2)
Total Protein: 7 g/dL (ref 6.0–8.3)

## 2015-01-24 LAB — SEDIMENTATION RATE: Sed Rate: 10 mm/hr (ref 0–22)

## 2015-01-24 LAB — LIPID PANEL
Cholesterol: 229 mg/dL — ABNORMAL HIGH (ref 0–200)
HDL: 86 mg/dL (ref >39.00)
LDL CALC: 132 mg/dL — AB (ref 0–99)
NONHDL: 143
Total CHOL/HDL Ratio: 3
Triglycerides: 57 mg/dL (ref 0.0–149.0)
VLDL: 11.4 mg/dL (ref 0.0–40.0)

## 2015-01-24 LAB — TSH
TSH: 0.6
TSH: 1.22 u[IU]/mL (ref 0.35–4.50)

## 2015-01-24 LAB — VITAMIN D 25 HYDROXY (VIT D DEFICIENCY, FRACTURES): Vit D, 25-Hydroxy: 16.9

## 2015-01-24 MED ORDER — LEVOTHYROXINE SODIUM 75 MCG PO TABS: 75.0000 ug | ORAL_TABLET | Freq: Every day | ORAL | Status: DC

## 2015-01-24 NOTE — Telephone Encounter (Signed)
Called and spoke to patient. Patient made appointment to see MD Hunter tomorrow at 11:15 to further evaluate breast concerns.

## 2015-01-24 NOTE — Telephone Encounter (Signed)
Patient came in for an appointment for the labs and spoke with Dr. Durene CalHunter while she was here. Patient states that it is cheaper for her get a 90 days. She wants her Synthroid sent to the [facility] [address] [place]. She states that this is okay with Dr. Durene CalHunter.

## 2015-01-24 NOTE — Telephone Encounter (Signed)
Angel Ryan from [facility] called and states orders were put in for a diagnostic mammogram and an ultrasound is need as well and more specific information as to where the site is that is being evaluated.   Spoke with Dr. Durene CalHunter and he states pt established on yesterday and had multiple problems to discuss; pt mentioned at the visit that she was having a problem but Dr. Durene CalHunter did not evaluate it yesterday.  He request that I call patient and [name] that either we can start with a annual mammogram and go from there or have pt come in for an office visit to be evaluated.  Called and spoke with pt and advised of both options.  Pt would like to start with annual mammogram screening and go from there.   Called [facility] and spoke with Racheal Patchesrena who states when pt called in for annual mammogram screening the was asked if she was having pain and the patient stated yes. Since pt told them she was having pain they cannot do just an annual screening.  Pt will have to have a diagnostic and ultrasound and more specific information as to where the pain is located.    Called and advised pt of the information given by [facility] and pt states she "does not want to worry about it because I feel like since they asked the question about a pain I cannot get what I need."  Advised pt that the office is working to assist pt with the mammogram but pt will need to be evaluated in order to have the information documented for insurance.  Pt states the pain is underneath her arms in the armpit area and on the outer sides of both breast. Pt states she understands that but does not want to come back in at this time for a visit.  Pt also states she is having a reaction to the pneumonia vaccine she received on yesterday. Pt states the area is red, swollen and itchy.  Pt states Dr. Durene CalHunter looked at the area when she was in for lab work this morning.

## 2015-01-24 NOTE — Telephone Encounter (Signed)
Medication resent and changed to 90day supply.

## 2015-01-25 ENCOUNTER — Encounter: Payer: Self-pay | Admitting: Gastroenterology

## 2015-01-25 ENCOUNTER — Ambulatory Visit (INDEPENDENT_AMBULATORY_CARE_PROVIDER_SITE_OTHER): Payer: Medicare Other | Admitting: Family Medicine

## 2015-01-25 ENCOUNTER — Encounter: Payer: Self-pay | Admitting: Family Medicine

## 2015-01-25 VITALS — BP 132/78 | HR 73 | Temp 98.2°F | Resp 19 | Ht 64.0 in | Wt 147.0 lb

## 2015-01-25 DIAGNOSIS — T881XXA Other complications following immunization, not elsewhere classified, initial encounter: Secondary | ICD-10-CM | POA: Diagnosis not present

## 2015-01-25 DIAGNOSIS — N644 Mastodynia: Secondary | ICD-10-CM | POA: Diagnosis not present

## 2015-01-25 NOTE — Progress Notes (Signed)
[name], MD Phone: [phone]  Subjective:   Angel Ryan is a 64 y.o. year old very pleasant female patient who presents with the following:  Bilateral Breast Pain Gave patient a handout for mammogram at end of last visit and she mentioned in passing that she had some axillary breast pain. When she was called by breast center, they required diagnostic mammogram with localization of painful areas as well as potential ultrasound. Patient returns today for breast exam. She reports axillary pain and into the outer portion of bilateral breasts. She also has slight pain underneath the nipple on the right. 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 I am awaiting records from [place] in regards to his pain. It is possible that her breast pain is coming from the same area. She's had this pain in her breasts for several months. Does not seem to be worsening. Nothing seems to make it better or worse other than pushing on the area which makes it worse. ROS-no fevers chills unintentional weight loss night sweats. Patient did have a local reaction to her pneumonia vaccine. She complains of some itching, swelling, redness on her right arm which are reviewed in the physical exam  Past Medical History- Patient Active Problem List   Diagnosis Date Noted  . Abdominal pain, chronic, epigastric 01/23/2015    Priority: High  . Hyperlipidemia 01/20/2008    Priority: Medium  . Hypothyroidism 10/20/2007    Priority: Medium  . Chronic back pain 10/20/2007    Priority: Medium  . Vitamin D deficiency 01/23/2015    Priority: Low  . Glaucoma     Priority: Low  . Osteoarthritis 03/04/2007    Priority: Low  . 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 latanoprost (XALATAN) 0.005 % ophthalmic solution 1 drop at  bedtime.    Marland Kitchen levothyroxine (SYNTHROID, LEVOTHROID) 75 MCG tablet Take 1 tablet (75 mcg total) by mouth daily before breakfast. 90 tablet 3   No current facility-administered medications for this visit.   Objective: BP 132/78 mmHg  Pulse 73  Temp(Src) 98.2 F (36.8 C) (Oral)  Resp 19  Ht  (1.626 m)  Wt 147 lb (66.679 kg)  BMI 25.22 kg/m2  SpO2 98% Gen: NAD, resting comfortably  Breast exam: bilateral breasts in periphery and into axilla with pain from 9-11 o clock on right breast and 2-3 o clock on left breast and into axilla. R breast also with some tenderness around 8 o clock about 3-5 cm below the nipple.  CV: RRR no murmurs rubs or gallops Lungs: CTAB no crackles, wheeze, rhonchi  R arm- erythema down R arm not extending not above site of injection and not below elbow. Slight warmth. No extension beyond sites yesterday   Assessment/Plan:  Bilateral breast pain This could be related to her radiculopathy from the midthoracic back. We are still awaiting records. She's not had mammogram since 2012. We will pursue diagnostic mammogram of bilateral breasts in area indicated by physical exams as well as ultrasound.  Local reaction immunization We reviewed icing the area and return precautions. Fortunately she does not need another pneumonia  We also reviewed her blood work from yesterday which was largely reassuring. Please see lab notes.    Orders Placed This Encounter  Procedures  . US BREAST LTD UNI RIGHT INC AXILLA    9 to 11 oclock on  r breast/breast tenderness at 8 oclock on r breast/2 to 3 oclock left breast    Standing Status: Future     Number of Occurrences:      Standing Expiration Date: 03/26/2016    Order Specific Question:  Reason for Exam (SYMPTOM  OR DIAGNOSIS REQUIRED)    Answer:  bilaeral breast pain    Order Specific Question:  Preferred imaging location?    [facility]  . US BREAST LTD UNI LEFT INC AXILLA    9 to 11 oclock on r  breast/breast tenderness at 8 oclock on r breast/2 to 3 oclock left breast    Standing Status: Future     Number of Occurrences:      Standing Expiration Date: 03/26/2016    Order Specific Question:  Reason for Exam (SYMPTOM  OR DIAGNOSIS REQUIRED)    Answer:  bilateral breast pain    Order Specific Question:  Preferred imaging location?    [facility]  . MM Digital Diagnostic Bilat    9 to 11 oclock on r breast/breast tenderness at 8 oclock on r breast/2 to 3 oclock left breast per suandrea at [facility]/epic order    Standing Status: Future     Number of Occurrences:      Standing Expiration Date: 03/26/2016    Order Specific Question:  Reason for Exam (SYMPTOM  OR DIAGNOSIS REQUIRED)    Answer:  bilateral breast pain    Order Specific Question:  Preferred imaging location?    [facility]

## 2015-01-25 NOTE — Progress Notes (Signed)
Pre visit review using our clinic review tool, if applicable. No additional management support is needed unless otherwise documented below in the visit note. 

## 2015-01-25 NOTE — Patient Instructions (Signed)
Ordering diagnostic mammogram and ultrasound of both breasts due to the pain you are having.   Arm looks some better-continue icing. Return for fevers, expanding redness

## 2015-01-26 ENCOUNTER — Encounter: Payer: Self-pay | Admitting: Family Medicine

## 2015-02-01 ENCOUNTER — Telehealth: Payer: Self-pay | Admitting: Family Medicine

## 2015-02-01 NOTE — Telephone Encounter (Signed)
Please update patient that I reviewed PCP records and she did have a CT of her liver in the past which was largely normal with a few small abnormalities. I still need records from the neurologist before making a decision on repeat imaging.

## 2015-02-01 NOTE — Progress Notes (Signed)
Records Tn PCP  08/08/2009-on oxycodone prior to hip replacement History being on xanax and temazepam for depression and anxiety IBS history noted-stated Zartac tx  02/14/10 mentinos chronic lumbar back pian. DJD, microdiskectomy 1994  First mention of abdominal pain epigastric. With constant burping x 2 months. Possible cholelithiasis and ordered US abdomen and refer to Dr. Allison Quarryobb (unknown speciality)   06/05/10 Chest CT-negative  06/05/10 CT abdomen and pelvis- cholecystectomy s/p. Mild prominence of intraheptic biliary ducts thought due to cholecystectomy. 2 small well defined low density foci in the dome of the liver are too small to charactirize but idstint for their tiny size and most likely represent hepatic cysts. Otherwise unremarkable. Lower pelvis imaging degraded by bilateral hip prostehsis.   04/03/13 mention of gastroscope planned  04/05/11 mentions metal allergy was tested and positive to nickel/cobalt. Patient had complained of issues after hip replacement and concern of nickel/cobalt in implant. Dealth with altered sensation smell/taste for 1 year. Referred back to ortho  06/10/12 hx low vitamin D as low as 19.3 was repleted

## 2015-02-02 NOTE — Telephone Encounter (Signed)
Pt notified and said she will have to look through her records and give us a call back when she remembers the name of the neurologist that she saw and finds the records from him.

## 2015-02-03 ENCOUNTER — Other Ambulatory Visit: Payer: Self-pay | Admitting: Family Medicine

## 2015-02-03 ENCOUNTER — Ambulatory Visit
Admission: RE | Admit: 2015-02-03 | Discharge: 2015-02-03 | Disposition: A | Discharge status: Home / Self Care | Payer: Medicare Other | Source: Ambulatory Visit | Attending: Family Medicine | Admitting: Family Medicine

## 2015-02-03 DIAGNOSIS — R920 Mammographic microcalcification found on diagnostic imaging of breast: Secondary | ICD-10-CM | POA: Diagnosis not present

## 2015-02-03 DIAGNOSIS — N644 Mastodynia: Secondary | ICD-10-CM

## 2015-02-07 ENCOUNTER — Other Ambulatory Visit: Payer: Self-pay | Admitting: Family Medicine

## 2015-02-07 ENCOUNTER — Ambulatory Visit
Admission: RE | Admit: 2015-02-07 | Discharge: 2015-02-07 | Disposition: A | Discharge status: Home / Self Care | Payer: Medicare Other | Source: Ambulatory Visit | Attending: Family Medicine | Admitting: Family Medicine

## 2015-02-07 DIAGNOSIS — R921 Mammographic calcification found on diagnostic imaging of breast: Secondary | ICD-10-CM

## 2015-02-07 DIAGNOSIS — N6489 Other specified disorders of breast: Secondary | ICD-10-CM | POA: Diagnosis not present

## 2015-02-07 DIAGNOSIS — N644 Mastodynia: Secondary | ICD-10-CM

## 2015-03-20 ENCOUNTER — Ambulatory Visit: Payer: Medicare Other | Admitting: Gastroenterology

## 2015-06-05 DIAGNOSIS — H4011X1 Primary open-angle glaucoma, mild stage: Secondary | ICD-10-CM | POA: Diagnosis not present

## 2015-06-05 DIAGNOSIS — H269 Unspecified cataract: Secondary | ICD-10-CM | POA: Diagnosis not present

## 2015-08-11 DIAGNOSIS — M25551 Pain in right hip: Secondary | ICD-10-CM | POA: Diagnosis not present

## 2015-08-11 DIAGNOSIS — M25552 Pain in left hip: Secondary | ICD-10-CM | POA: Diagnosis not present

## 2015-10-25 DIAGNOSIS — M542 Cervicalgia: Secondary | ICD-10-CM | POA: Diagnosis not present

## 2015-11-10 DIAGNOSIS — H16221 Keratoconjunctivitis sicca, not specified as Sjogren's, right eye: Secondary | ICD-10-CM | POA: Diagnosis not present

## 2015-11-10 DIAGNOSIS — H401131 Primary open-angle glaucoma, bilateral, mild stage: Secondary | ICD-10-CM | POA: Diagnosis not present

## 2015-11-10 DIAGNOSIS — H527 Unspecified disorder of refraction: Secondary | ICD-10-CM | POA: Diagnosis not present

## 2015-11-10 DIAGNOSIS — H269 Unspecified cataract: Secondary | ICD-10-CM | POA: Diagnosis not present

## 2016-01-18 ENCOUNTER — Ambulatory Visit (INDEPENDENT_AMBULATORY_CARE_PROVIDER_SITE_OTHER): Payer: Medicare Other | Admitting: Family Medicine

## 2016-01-18 ENCOUNTER — Other Ambulatory Visit: Payer: Self-pay | Admitting: Family Medicine

## 2016-01-18 ENCOUNTER — Encounter: Payer: Self-pay | Admitting: Family Medicine

## 2016-01-18 VITALS — BP 130/80 | HR 78 | Temp 98.1°F | Wt 149.0 lb

## 2016-01-18 DIAGNOSIS — E039 Hypothyroidism, unspecified: Secondary | ICD-10-CM

## 2016-01-18 DIAGNOSIS — R1013 Epigastric pain: Secondary | ICD-10-CM | POA: Diagnosis not present

## 2016-01-18 DIAGNOSIS — G8929 Other chronic pain: Secondary | ICD-10-CM | POA: Diagnosis not present

## 2016-01-18 DIAGNOSIS — Z20828 Contact with and (suspected) exposure to other viral communicable diseases: Secondary | ICD-10-CM

## 2016-01-18 DIAGNOSIS — E785 Hyperlipidemia, unspecified: Secondary | ICD-10-CM | POA: Diagnosis not present

## 2016-01-18 DIAGNOSIS — R319 Hematuria, unspecified: Secondary | ICD-10-CM | POA: Diagnosis not present

## 2016-01-18 DIAGNOSIS — Z780 Asymptomatic menopausal state: Secondary | ICD-10-CM

## 2016-01-18 LAB — CBC WITH DIFFERENTIAL/PLATELET
Basophils Absolute: 0 10*3/uL (ref 0.0–0.1)
Basophils Relative: 0.5 % (ref 0.0–3.0)
EOS ABS: 0.1 10*3/uL (ref 0.0–0.7)
Eosinophils Relative: 1.6 % (ref 0.0–5.0)
HCT: 41.1 % (ref 36.0–46.0)
HEMOGLOBIN: 13.6 g/dL (ref 12.0–15.0)
Lymphocytes Relative: 26.4 % (ref 12.0–46.0)
Lymphs Abs: 1.3 10*3/uL (ref 0.7–4.0)
MCHC: 33 g/dL (ref 30.0–36.0)
MCV: 92.4 fl (ref 78.0–100.0)
Monocytes Absolute: 0.4 10*3/uL (ref 0.1–1.0)
Monocytes Relative: 7.9 % (ref 3.0–12.0)
NEUTROS PCT: 63.6 % (ref 43.0–77.0)
Neutro Abs: 3 10*3/uL (ref 1.4–7.7)
Platelets: 179 10*3/uL (ref 150.0–400.0)
RBC: 4.45 Mil/uL (ref 3.87–5.11)
RDW: 14.4 % (ref 11.5–15.5)
WBC: 4.8 10*3/uL (ref 4.0–10.5)

## 2016-01-18 LAB — POC URINALSYSI DIPSTICK (AUTOMATED)
Bilirubin, UA: NEGATIVE
GLUCOSE UA: NEGATIVE
Ketones, UA: NEGATIVE
LEUKOCYTES UA: NEGATIVE
NITRITE UA: NEGATIVE
PROTEIN UA: NEGATIVE
UROBILINOGEN UA: 0.2
pH, UA: 5.5

## 2016-01-18 LAB — LIPID PANEL
Cholesterol: 228 mg/dL — ABNORMAL HIGH (ref 0–200)
HDL: 94.4 mg/dL (ref >39.00)
LDL Cholesterol: 125 mg/dL — ABNORMAL HIGH (ref 0–99)
NonHDL: 133.32
TRIGLYCERIDES: 43 mg/dL (ref 0.0–149.0)
Total CHOL/HDL Ratio: 2
VLDL: 8.6 mg/dL (ref 0.0–40.0)

## 2016-01-18 LAB — COMPREHENSIVE METABOLIC PANEL
ALK PHOS: 65 U/L (ref 39–117)
ALT: 11 U/L (ref 0–35)
AST: 19 U/L (ref 0–37)
Albumin: 4.1 g/dL (ref 3.5–5.2)
BUN: 12 mg/dL (ref 6–23)
CO2: 28 mEq/L (ref 19–32)
Calcium: 9.1 mg/dL (ref 8.4–10.5)
Chloride: 106 mEq/L (ref 96–112)
Creatinine, Ser: 0.81 mg/dL (ref 0.40–1.20)
GFR: 75.01 mL/min (ref >60.00)
Glucose, Bld: 105 mg/dL — ABNORMAL HIGH (ref 70–99)
Potassium: 4.2 mEq/L (ref 3.5–5.1)
Sodium: 140 mEq/L (ref 135–145)
TOTAL PROTEIN: 6.6 g/dL (ref 6.0–8.3)
Total Bilirubin: 0.7 mg/dL (ref 0.2–1.2)

## 2016-01-18 LAB — LIPASE: Lipase: 20 U/L (ref 11.0–59.0)

## 2016-01-18 LAB — TSH: TSH: 0.45 u[IU]/mL (ref 0.35–4.50)

## 2016-01-18 LAB — URINALYSIS, MICROSCOPIC ONLY

## 2016-01-18 NOTE — Addendum Note (Signed)
Addended by: Baldwin CrownJOHNSON, SHAQUETTA D on: 01/18/2016 01:28 PM   Modules accepted: Orders

## 2016-01-18 NOTE — Assessment & Plan Note (Signed)
S: reasonably controlled on no medicine in past with 10 year risk <6% last year. No myalgias.  Lab Results  Component Value Date   CHOL 229* 01/24/2015   HDL 86.00 01/24/2015   LDLCALC 132* 01/24/2015   LDLDIRECT 163.2 10/12/2007   TRIG 57.0 01/24/2015   CHOLHDL 3 01/24/2015   A/P: will check lipids today and update risk calculation- consider statin

## 2016-01-18 NOTE — Patient Instructions (Addendum)
Schedule bone density at check out desk.  Labs before you leave including urine. Make sure to pick up stool cards  We will call you about repeat CT scan abdomen/pelvis  Once we get thyroid results- we will send in thyroid medicine- call us if this is not done within a week

## 2016-01-18 NOTE — Addendum Note (Signed)
Addended by: Bonnye FavaKWEI, NANA K on: 01/18/2016 12:29 PM   Modules accepted: Orders

## 2016-01-18 NOTE — Assessment & Plan Note (Signed)
S: still have not received neuro records- patient will look for these. She continues to have chronic abdominal pain for years now. Seems to be worse recently and especially when she eats. She is still reasonably functional and [place] the [place]. Has had some easier bruising more recently as well as nocturia 3-4x a night.  A/P: does not want to trial PPI or probiotics. Does not want colonoscopy, has had EGD. Does agree to stool cards. Will get CT abd/pelvis given worsening. Screen for hep C. Check UA- consider culture depending on findings

## 2016-01-18 NOTE — Assessment & Plan Note (Signed)
S: stable in past on levothyroxine 75 mcg Lab Results  Component Value Date   TSH 1.22 01/24/2015  A/P: repeat TSh today

## 2016-01-18 NOTE — Progress Notes (Signed)
Angel ConchStephen Hunter, MD  Subjective:  Angel CowmanKaren L Ryan is a 65 y.o. year old very pleasant female patient who presents for/with See problem oriented charting ROS- no chest pain or shortness of breath. No vomiting or diarrhea. No relief of abdominal pain with bowel movement.   Past Medical History-  Patient Active Problem List   Diagnosis Date Noted  . Abdominal pain, chronic, epigastric 01/23/2015    Priority: High  . Hyperlipidemia 01/20/2008    Priority: Medium  . Hypothyroidism 10/20/2007    Priority: Medium  . Chronic back pain 10/20/2007    Priority: Medium  . Vitamin D deficiency 01/23/2015    Priority: Low  . Glaucoma     Priority: Low  . Osteoarthritis 03/04/2007    Priority: Low  . 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 latanoprost (XALATAN) 0.005 % ophthalmic solution 1 drop at bedtime.    Marland Kitchen. levothyroxine (SYNTHROID, LEVOTHROID) 75 MCG tablet Take 1 tablet (75 mcg total) by mouth daily before breakfast. 90 tablet 3   No current facility-administered medications for this visit.    Objective: BP 130/80 mmHg  Pulse 78  Temp(Src) 98.1 F (36.7 C)  Wt 149 lb (67.586 kg) Gen: NAD, resting comfortably No obvious thyromegaly CV: RRR no murmurs rubs or gallops Lungs: CTAB no crackles, wheeze, rhonchi Abdomen: soft/very tender in RUQ and epigastric area/nondistended/normal bowel sounds. No rebound or guarding.  Ext: no edema Skin: warm, dry Neuro: grossly normal, moves all extremities  Assessment/Plan:  Abdominal pain, chronic, epigastric S: still have not received neuro records- patient will look for these. She continues to have chronic abdominal pain for years now. Seems to be worse recently and especially when she eats. She is still reasonably functional and walks [place] the mall. Has had some easier bruising more recently as well as  nocturia 3-4x a night. No gallbladder- cholecystectomy.  A/P: does not want to trial PPI or probiotics. Does not want colonoscopy, has had EGD. Does agree to stool cards. Will get CT abd/pelvis given worsening. Screen for hep C. Check UA- consider culture depending on findings  Hypothyroidism S: stable in past on levothyroxine 75 mcg Lab Results  Component Value Date   TSH 1.22 01/24/2015  A/P: repeat TSh today  Hyperlipidemia S: reasonably controlled on no medicine in past with 10 year risk <6% last year. No myalgias.  Lab Results  Component Value Date   CHOL 229* 01/24/2015   HDL 86.00 01/24/2015   LDLCALC 132* 01/24/2015   LDLDIRECT 163.2 10/12/2007   TRIG 57.0 01/24/2015   CHOLHDL 3 01/24/2015   A/P: will check lipids today and update risk calculation- consider statin  1 year AWV verbal- patient states she has learned to deal with pain- does not really want to try a lot of new medicines unless specific cause known. Return precautions advised.   Orders Placed This Encounter  Procedures  . DG Bone Density    Standing Status: Future     Number of Occurrences:      Standing Expiration Date: 03/19/2017    Order Specific Question:  Reason for Exam (SYMPTOM  OR DIAGNOSIS REQUIRED)    Answer:  post menopausal    Order Specific Question:  Preferred imaging location?    Answer:  [facility]  . CT Abdomen Pelvis W Contrast    Standing Status: Future     Number of Occurrences:  Standing Expiration Date: 04/19/2017    Order Specific Question:  If indicated for the ordered procedure, I authorize the administration of contrast media per Radiology protocol    Answer:  Yes    Order Specific Question:  Reason for Exam (SYMPTOM  OR DIAGNOSIS REQUIRED)    Answer:  chronic epigastric pain for years- recent worsening. Worse after meals. Has had EGD in past. declines colonoscopy    Order Specific Question:  Preferred imaging location?    Answer:  [place]  . CBC with  Differential/Platelet  . Comprehensive metabolic panel    [place]  . Lipase  . Hepatitis C antibody, reflex    [facility]  . TSH    [place]  . POC Hemoccult Bld/Stl (3-Cd Home Screen)    Send home    Standing Status: Future     Number of Occurrences:      Standing Expiration Date: 01/17/2017  . POCT Urinalysis Dipstick (Automated)

## 2016-01-19 LAB — HEPATITIS C ANTIBODY: HCV AB: NEGATIVE

## 2016-01-19 MED ORDER — LEVOTHYROXINE SODIUM 75 MCG PO TABS: 75.0000 ug | ORAL_TABLET | Freq: Every day | ORAL | Status: DC

## 2016-01-19 NOTE — Addendum Note (Signed)
Addended by: Sallee LangeFUNDERBURK, Jrake Rodriquez A on: 01/19/2016 10:00 AM   Modules accepted: Orders

## 2016-01-22 LAB — POC HEMOCCULT BLD/STL (HOME/3-CARD/SCREEN)
Card #2 Fecal Occult Blod, POC: NEGATIVE
Card #3 Fecal Occult Blood, POC: NEGATIVE
Fecal Occult Blood, POC: NEGATIVE

## 2016-01-22 NOTE — Addendum Note (Signed)
Addended by: Baldwin CrownJOHNSON, Ronith Berti D on: 01/22/2016 12:39 PM   Modules accepted: Orders

## 2016-01-24 ENCOUNTER — Ambulatory Visit (INDEPENDENT_AMBULATORY_CARE_PROVIDER_SITE_OTHER)
Admission: RE | Admit: 2016-01-24 | Discharge: 2016-01-24 | Disposition: A | Discharge status: Home / Self Care | Payer: Medicare Other | Source: Ambulatory Visit | Attending: Family Medicine | Admitting: Family Medicine

## 2016-01-24 DIAGNOSIS — R1013 Epigastric pain: Secondary | ICD-10-CM | POA: Diagnosis not present

## 2016-01-24 DIAGNOSIS — G8929 Other chronic pain: Secondary | ICD-10-CM

## 2016-01-24 MED ORDER — IOPAMIDOL (ISOVUE-300) INJECTION 61%
100.0000 mL | Freq: Once | INTRAVENOUS | Status: AC | PRN
  Administered 2016-01-24: 100 mL via INTRAVENOUS

## 2016-04-29 ENCOUNTER — Telehealth: Payer: Self-pay | Admitting: Family Medicine

## 2016-04-29 NOTE — Telephone Encounter (Signed)
Message from [facility]ie M [facility] River[facility], LPN sent at 1/61/09606/26/2017 4:15 PM EDT -----    Scheduled for Wednesday at 10:00

## 2016-04-29 NOTE — Telephone Encounter (Signed)
Do not recall this conversation- advise office visit to discuss. May need EKG at baseline

## 2016-04-29 NOTE — Telephone Encounter (Signed)
Pt states DR hunter mentioned her wearing a heart monitor and she would like for   dr hunter to call and and discuss.  Pt aware dr Durene CalHunter is out of office today.

## 2016-05-01 ENCOUNTER — Encounter: Payer: Self-pay | Admitting: Family Medicine

## 2016-05-01 ENCOUNTER — Ambulatory Visit (INDEPENDENT_AMBULATORY_CARE_PROVIDER_SITE_OTHER)
Admission: RE | Admit: 2016-05-01 | Discharge: 2016-05-01 | Disposition: A | Discharge status: Home / Self Care | Payer: Medicare Other | Source: Ambulatory Visit | Attending: Family Medicine | Admitting: Family Medicine

## 2016-05-01 ENCOUNTER — Ambulatory Visit (INDEPENDENT_AMBULATORY_CARE_PROVIDER_SITE_OTHER): Payer: Medicare Other | Admitting: Family Medicine

## 2016-05-01 VITALS — BP 130/88 | HR 77 | Temp 98.2°F | Ht 64.0 in | Wt 150.0 lb

## 2016-05-01 DIAGNOSIS — R0602 Shortness of breath: Secondary | ICD-10-CM

## 2016-05-01 DIAGNOSIS — R55 Syncope and collapse: Secondary | ICD-10-CM | POA: Diagnosis not present

## 2016-05-01 DIAGNOSIS — R0609 Other forms of dyspnea: Secondary | ICD-10-CM | POA: Diagnosis not present

## 2016-05-01 LAB — BASIC METABOLIC PANEL
BUN: 11 mg/dL (ref 6–23)
CALCIUM: 9.2 mg/dL (ref 8.4–10.5)
CO2: 27 mEq/L (ref 19–32)
CREATININE: 0.77 mg/dL (ref 0.40–1.20)
Chloride: 107 mEq/L (ref 96–112)
GFR: 79.46 mL/min (ref >60.00)
Glucose, Bld: 92 mg/dL (ref 70–99)
Potassium: 4.1 mEq/L (ref 3.5–5.1)
Sodium: 138 mEq/L (ref 135–145)

## 2016-05-01 LAB — CBC
HCT: 40 % (ref 36.0–46.0)
HEMOGLOBIN: 13.2 g/dL (ref 12.0–15.0)
MCHC: 32.9 g/dL (ref 30.0–36.0)
MCV: 94 fl (ref 78.0–100.0)
PLATELETS: 234 10*3/uL (ref 150.0–400.0)
RBC: 4.26 Mil/uL (ref 3.87–5.11)
RDW: 13.6 % (ref 11.5–15.5)
WBC: 4.6 10*3/uL (ref 4.0–10.5)

## 2016-05-01 NOTE — Progress Notes (Signed)
Subjective:  Angel CowmanKaren L Seyller is a 65 y.o. year old very pleasant female patient who presents for/with See problem oriented charting ROS- no chest pain or dizziness. Did have one syncopal episode mentioned below..see any ROS included in HPI as well.   Past Medical History-  Patient Active Problem List   Diagnosis Date Noted  . Abdominal pain, chronic, epigastric 01/23/2015    Priority: High  . Hyperlipidemia 01/20/2008    Priority: Medium  . Hypothyroidism 10/20/2007    Priority: Medium  . Chronic back pain 10/20/2007    Priority: Medium  . Vitamin D deficiency 01/23/2015    Priority: Low  . Glaucoma     Priority: Low  . Osteoarthritis 03/04/2007    Priority: Low  . 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 latanoprost (XALATAN) 0.005 % ophthalmic solution 1 drop at bedtime.    Marland Kitchen. levothyroxine (SYNTHROID, LEVOTHROID) 75 MCG tablet Take 1 tablet (75 mcg total) by mouth daily before breakfast. 90 tablet 3   No current facility-administered medications for this visit.    Objective: BP 130/88 mmHg  Pulse 77  Temp(Src) 98.2 F (36.8 C) (Oral)  Ht 5\' 4"  (1.626 m)  Wt 150 lb (68.04 kg)  BMI 25.73 kg/m2  SpO2 97% Gen: NAD, resting comfortably CV: RRR no murmurs rubs or gallops Lungs: CTAB no crackles, wheeze, rhonchi Ext: no edema Skin: warm, dry, no rash Neuro: grossly normal, moves all extremities  EKG: NSR, normal axis, normal intervals, no hypertrophy, no st or t wave changes. RSR in v1. No obvious ischemia.   Assessment/Plan:  Shortness of breath  Syncope  S: Shortness of breath. Walks 5 miles a day for 2 years but friends and patient have noted that she seems to get winded sooner for 4-5 weeks (seems to be huffing after 2 laps- still walks the 18 laps). Mildly worsening in this time frame but without SOB.   End of march, 1st of April had come in from  walk and was working in [employer] and had LOC- not sure how long she was on floor before getting up- did not hit head- tailbone bruised significantly for weeks.  A/P: Chest x-ray with chronic bronchitic changes but doubt this is the cause of her shortness of breath without prior smoking history. CBC and been largely unremarkable. She has no Swelling or pain in her calves and wells score is 0 for PE. Did not get d-dimer has thought very low yield. Will get stress echocardiogram with concern for potential ischemia. Risk factors include age, family history of CAD but her father was in the 5270s, mild hyperlipidemia. She did have a case of syncope after walking and think ruling out ischemia is very important here. I also wonder if this was more presyncope given the fall seemed somewhat controlled other than the bruising of tailbone. Could also been dehydration after recent walk. Could consider cardiac monitoring or cardiology referral depending on above findings as well as if she has recurrence or worsening of symptoms.  Orders Placed This Encounter  Procedures  . DG Chest 2 View    Standing Status: Future     Number of Occurrences: 1     Standing Expiration Date: 07/01/2017    Order Specific Question:  Reason for Exam (SYMPTOM  OR DIAGNOSIS REQUIRED)    Answer:  shortness of breath    Order Specific Question:  Preferred imaging location?  Answer:  [facility]  . CBC    [place]  . Basic metabolic panel    [place]  . EKG 12-Lead    Order Specific Question:  Where should this test be performed    Answer:  Other  . Echo stress    Standing Status: Future     Number of Occurrences:      Standing Expiration Date: 08/01/2017    Order Specific Question:  Where should this be performed?    Answer:  [facility]    Order Specific Question:  Does the patient weigh less than or greater than 250 lbs?    Answer:  Patient weighs less than 250 lbs    Order Specific Question:  Complete  or Limited study?    Answer:  Complete    Order Specific Question:  Does the patient have a known history of hypersensitivity to Perflutren (aka Hospital doctorDefinity-Image Enhancing Agent for echocardiograms - CHECK ALLERGIES)    Answer:  No    Order Specific Question:  ADMINISTER PERFLUTERN    Answer:  ADMINISTER PERFLUTREN    Order Specific Question:  Stress with Dobutamine or Treadmill with exercise?    Answer:  Treadmill w/ exercise    Order Specific Question:  Expected Date:    Answer:  1 week    Order Specific Question:  Reason for exam-Echo    Answer:  Dyspnea  786.09 / R06.00   The duration of face-to-face time during this visit was 25 minutes. Greater than 50% of this time was spent in counseling, explanation of diagnosis, planning of further management, and/or coordination of care.    Return precautions advised.  Tana ConchStephen Hunter, MD

## 2016-05-01 NOTE — Patient Instructions (Signed)
Labs before you leave  Then go get chest x-ray  We will call you within a week about your referral to stress echocardiogram. If you do not hear within 2 weeks, give us a call.   If new or worsening symptoms let us know immediately

## 2016-05-01 NOTE — Progress Notes (Signed)
Pre visit review using our clinic review tool, if applicable. No additional management support is needed unless otherwise documented below in the visit note. 

## 2016-05-08 ENCOUNTER — Ambulatory Visit: Payer: Medicare Other | Admitting: Family Medicine

## 2016-05-16 DIAGNOSIS — H18413 Arcus senilis, bilateral: Secondary | ICD-10-CM | POA: Diagnosis not present

## 2016-05-16 DIAGNOSIS — H527 Unspecified disorder of refraction: Secondary | ICD-10-CM | POA: Diagnosis not present

## 2016-05-16 DIAGNOSIS — H401131 Primary open-angle glaucoma, bilateral, mild stage: Secondary | ICD-10-CM | POA: Diagnosis not present

## 2016-05-28 ENCOUNTER — Telehealth (HOSPITAL_COMMUNITY): Payer: Self-pay | Admitting: *Deleted

## 2016-05-29 ENCOUNTER — Other Ambulatory Visit (HOSPITAL_COMMUNITY): Payer: Medicare Other

## 2016-07-04 ENCOUNTER — Other Ambulatory Visit: Payer: Self-pay

## 2016-09-23 ENCOUNTER — Telehealth: Payer: Self-pay | Admitting: Family Medicine

## 2016-09-23 NOTE — Telephone Encounter (Signed)
° °  Pt call to say that [place] and would like to have some paper scripts to take with her until she can find a new doctor. She said it will probably take about 6 months to found a new doctor    levothyroxine (SYNTHROID, LEVOTHROID) 75 MCG tablet

## 2016-09-23 NOTE — Telephone Encounter (Signed)
I dont have a problem with refill- bigger problem is she never followed up for her syncopal episode and stress test from June- can we check on what is going on there?

## 2016-09-24 ENCOUNTER — Other Ambulatory Visit: Payer: Self-pay

## 2016-09-24 MED ORDER — LEVOTHYROXINE SODIUM 75 MCG PO TABS: 75.0000 ug | ORAL_TABLET | Freq: Every day | ORAL | 1 refills | Status: AC

## 2016-09-24 NOTE — Telephone Encounter (Signed)
I spoke with the patient who stated that she had the chest x-ray done and since they didn't find anything "she doesn't need another senseless test."  Prescription placed up front for patient pick up and patient is aware

## 2016-09-24 NOTE — Telephone Encounter (Signed)
Spoke with patient. Strongly advised her to follow though with stress test. She is moving in 2 weeks. Declines for now- aware of potential cardiac risks. She will establish when she moves and states has cardiologist she can see there.

## 2016-10-03 ENCOUNTER — Telehealth: Payer: Self-pay | Admitting: Family Medicine

## 2016-10-03 NOTE — Telephone Encounter (Signed)
Called Angel Ryan to schedule awv appt. Left msg for pt to call office to schedule appt.

## 2016-10-11 DIAGNOSIS — M542 Cervicalgia: Secondary | ICD-10-CM | POA: Diagnosis not present

## 2016-10-11 DIAGNOSIS — M25551 Pain in right hip: Secondary | ICD-10-CM | POA: Diagnosis not present

## 2016-10-11 DIAGNOSIS — M25552 Pain in left hip: Secondary | ICD-10-CM | POA: Diagnosis not present

## 2016-10-15 ENCOUNTER — Other Ambulatory Visit (HOSPITAL_COMMUNITY): Payer: Medicare Other

## 2016-10-15 ENCOUNTER — Telehealth: Payer: Self-pay | Admitting: Family Medicine
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
## 2016-10-15 NOTE — Telephone Encounter (Signed)
Spoke with pharmacy and let them know it was ok to switch. Left a voicemail message letting her know we okayed the switch and that she would need a follow up TSH in 6 weeks. I did leave a call back number

## 2016-10-15 NOTE — Telephone Encounter (Signed)
Ok to change but due to change lets have her follow up 6 weeks for TSH check. Really appreciate pharmacy letting us know about the change

## 2016-10-16 ENCOUNTER — Other Ambulatory Visit (HOSPITAL_COMMUNITY): Payer: Medicare Other

## 2016-11-29 IMAGING — CT CT ABD-PELV W/ CM
2 of 5 series · 16 of 46 positions shown, 18 images · IV contrast (ISOVUE 300)
Comparison: None.

CLINICAL DATA: Chronic epigastric pain for several years

EXAM:
CT ABDOMEN AND PELVIS WITH CONTRAST
TECHNIQUE: Multidetector CT imaging of the abdomen and pelvis was performed
using the standard protocol following bolus administration of
intravenous contrast.
CONTRAST:  100mL GNK36V-HHH IOPAMIDOL (GNK36V-HHH) INJECTION 61%

[Series 2: abd/ pelvis · axial · 0.74mm/px · z∈[-426,-36]mm · 13 of 88 slices shown, 15 images]
[im 5/88  soft-tissue]
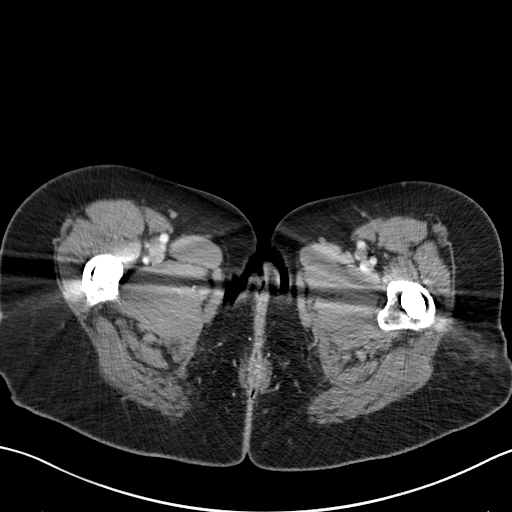
[im 5/88  bone]
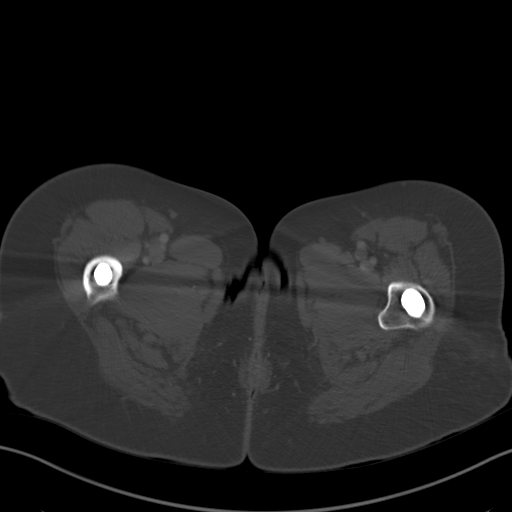
[im 14/88  soft-tissue]
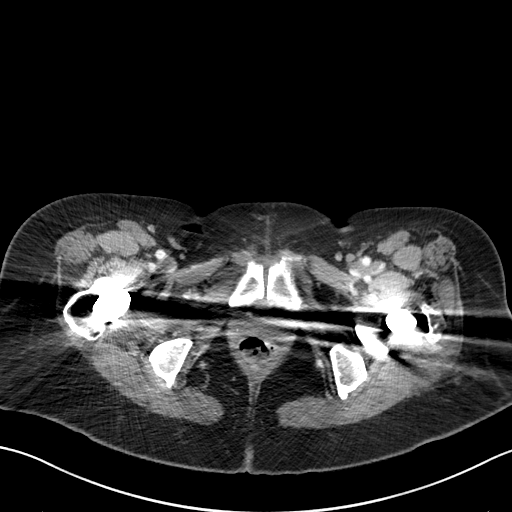
[im 19/88  soft-tissue]
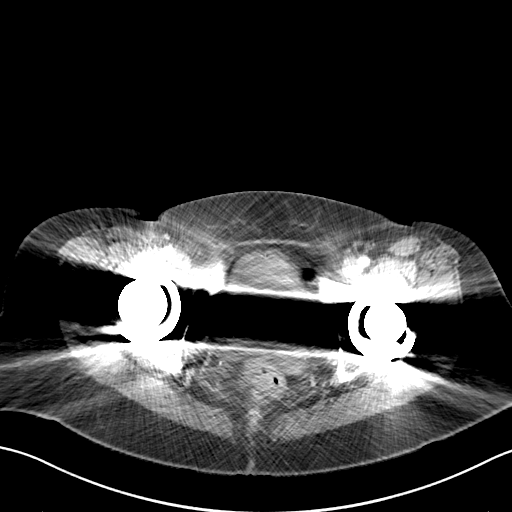
[im 23/88  soft-tissue]
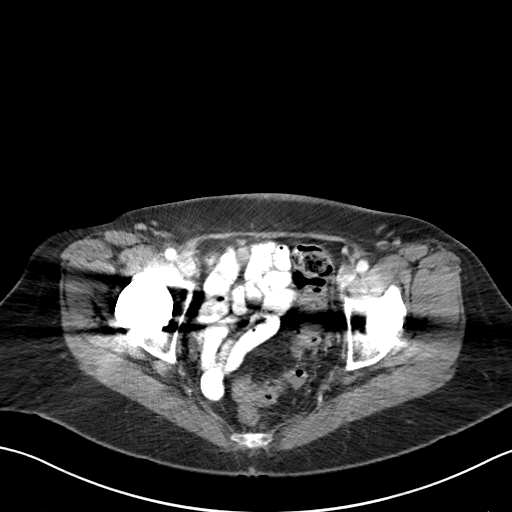
[im 33/88  soft-tissue]
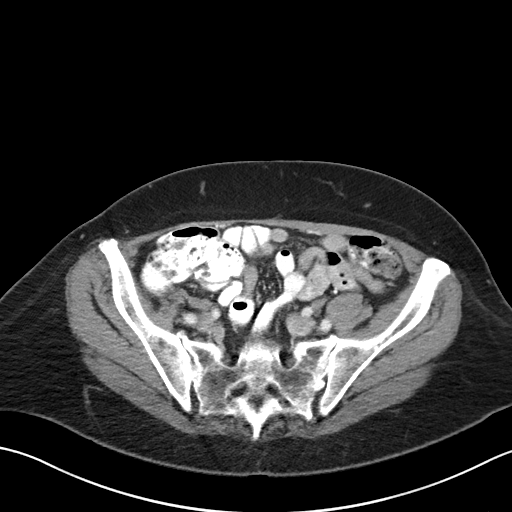
[im 37/88  soft-tissue]
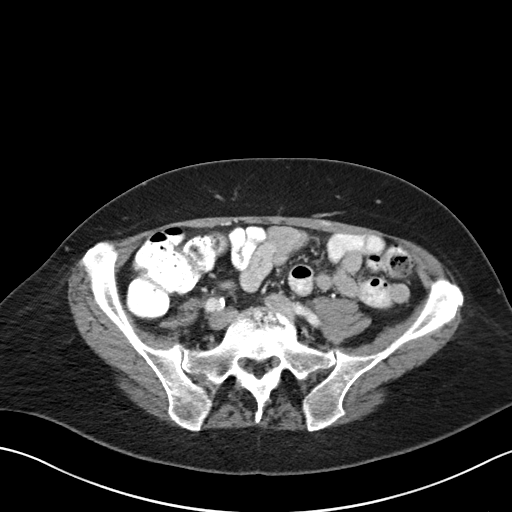
[im 46/88  soft-tissue]
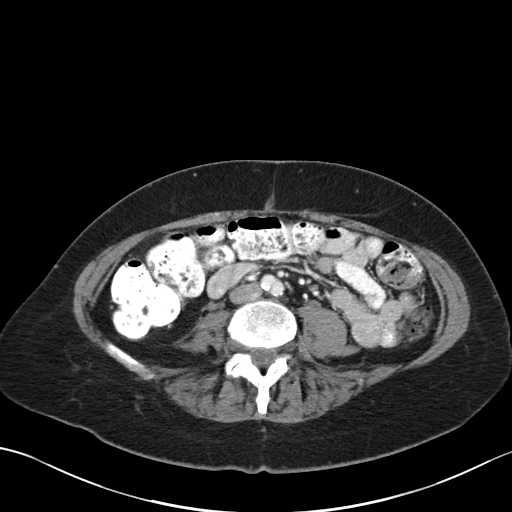
[im 51/88  soft-tissue]
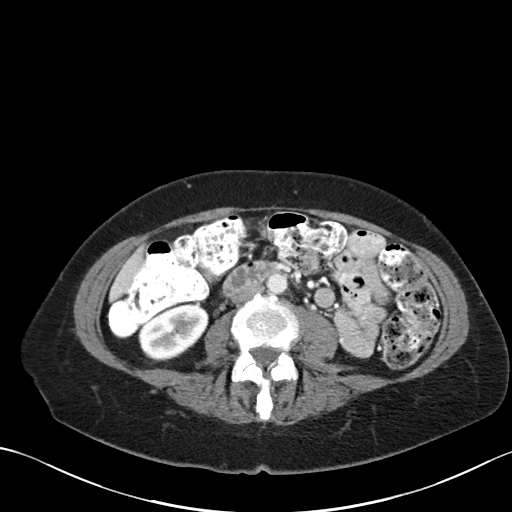
[im 55/88  soft-tissue]
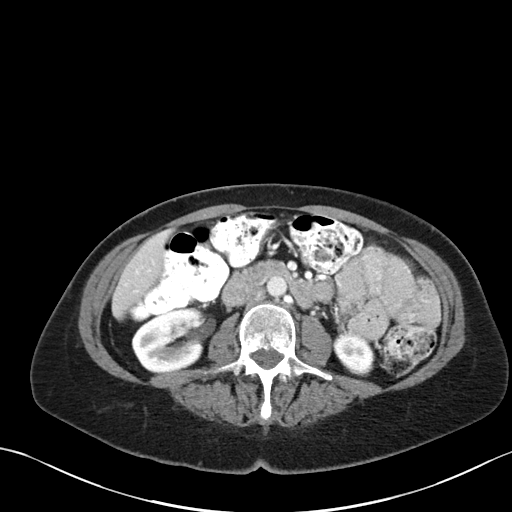
[im 55/88  bone]
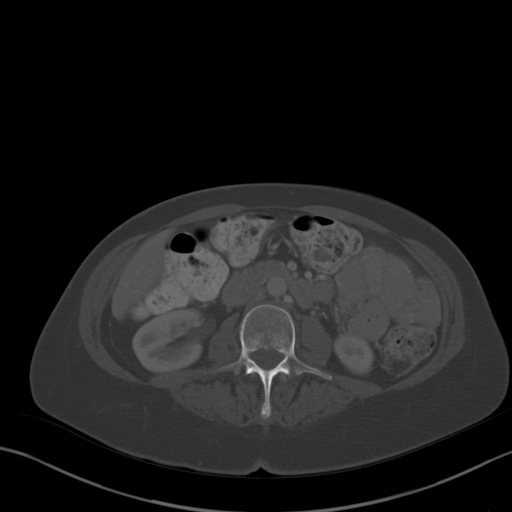
[im 65/88  soft-tissue]
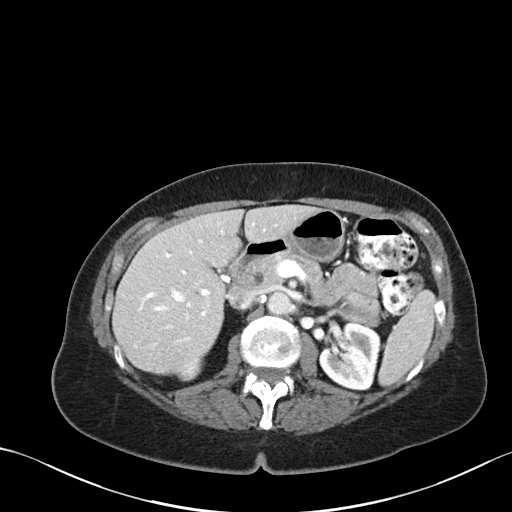
[im 69/88  soft-tissue]
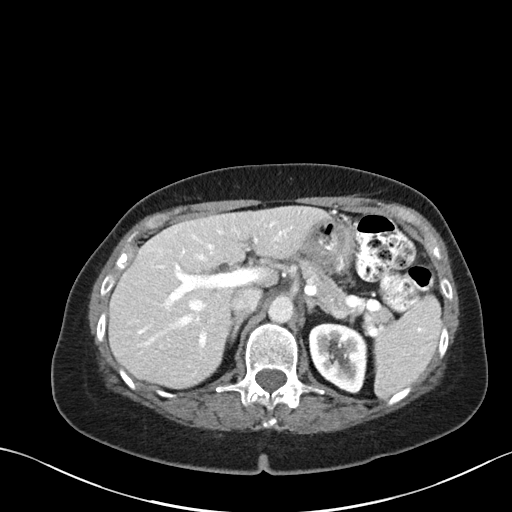
[im 74/88  soft-tissue]
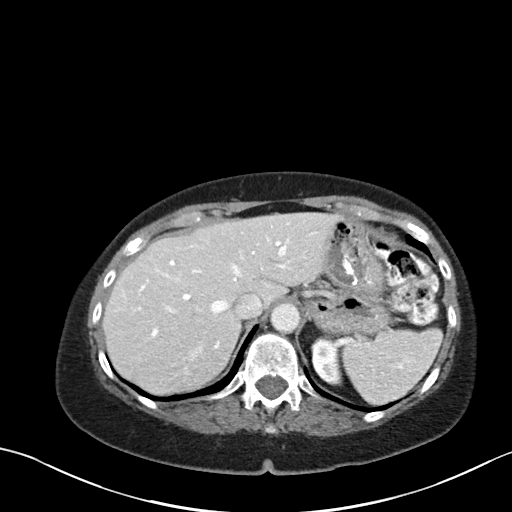
[im 83/88  soft-tissue]
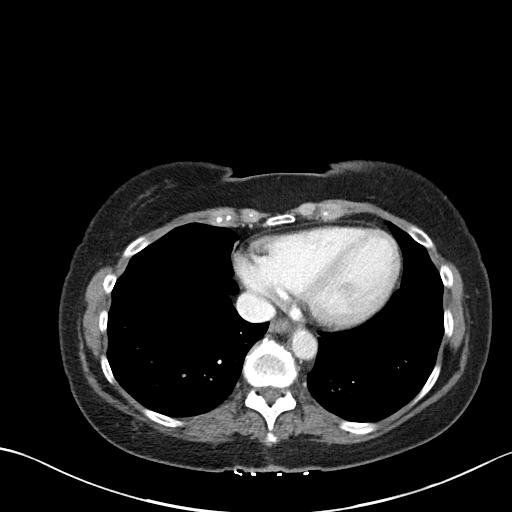

[Series 5: coronal soft tissue · coronal · 0.54mm/px · 3 of 65 slices shown]
[im 22/65  soft-tissue]
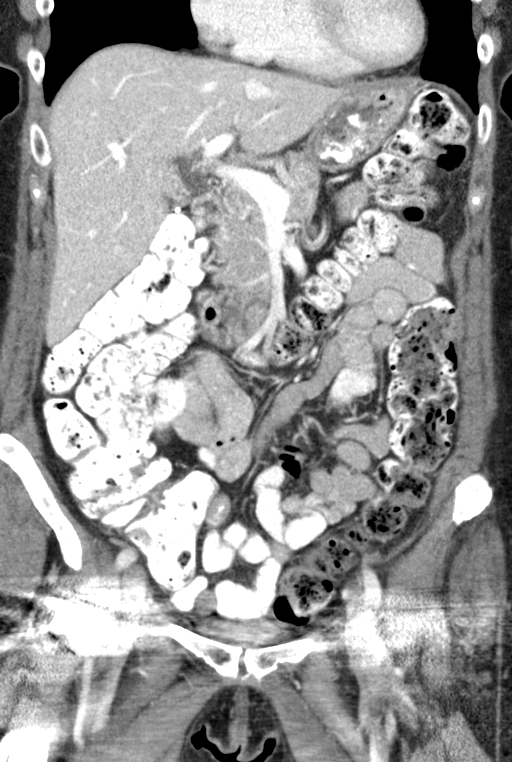
[im 29/65  soft-tissue]
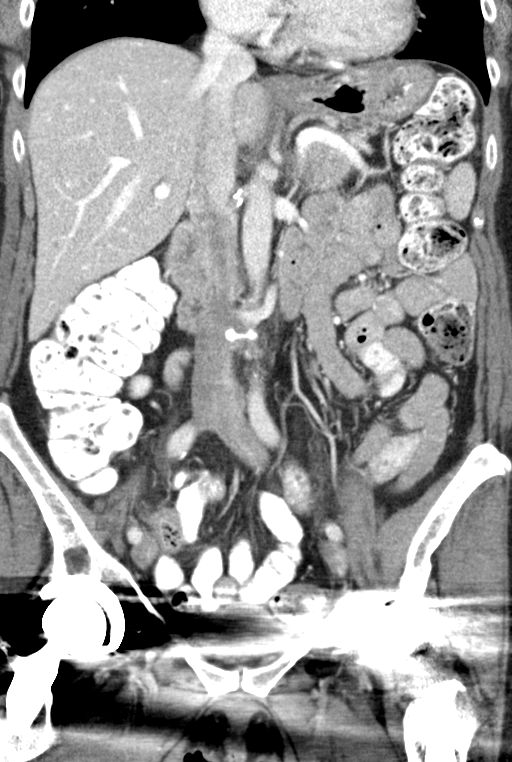
[im 36/65  soft-tissue]
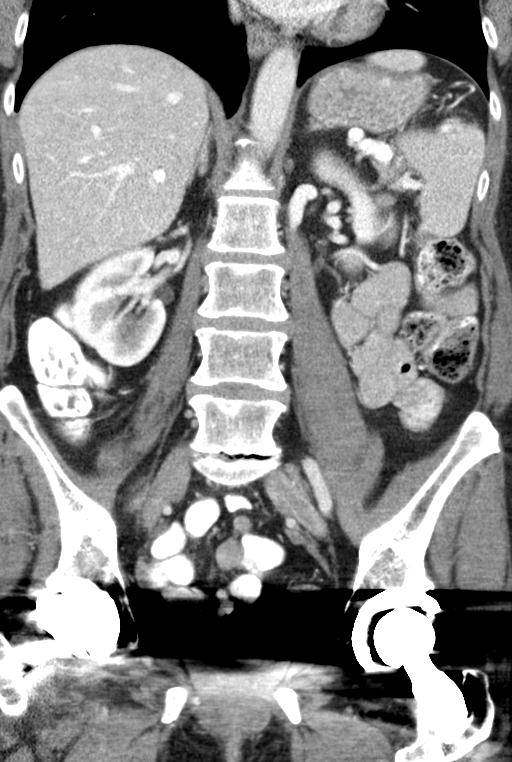

[16 of 46 positions shown; findings below may reference images not displayed]

FINDINGS: The lung bases are free of acute infiltrate or sizable effusion.

The liver, spleen, adrenal glands and pancreas are within normal
limits. The gallbladder has been surgically removed. The kidneys are
well visualized and demonstrate a normal enhancement pattern. Normal
excretion of contrast is noted bilaterally. The bladder is partially
distended evaluation is limited by scatter artifact from the
bilateral hip joints.

Mild aortoiliac calcifications are noted. Scattered diverticular
change is seen without diverticulitis. The osseous structures show
no acute abnormality.
IMPRESSION: No acute abnormality noted.

## 2016-12-03 ENCOUNTER — Telehealth: Payer: Self-pay | Admitting: Family Medicine

## 2017-01-09 NOTE — Telephone Encounter (Signed)
error 

## 2017-03-07 IMAGING — DX DG CHEST 2V
2 series · 2 of 2 positions shown · non-contrast
Comparison: None in PACs

CLINICAL DATA: Dyspnea on exertion for the past 4 weeks; history of
pneumonia

EXAM:
CHEST  2 VIEW

[chest pa]
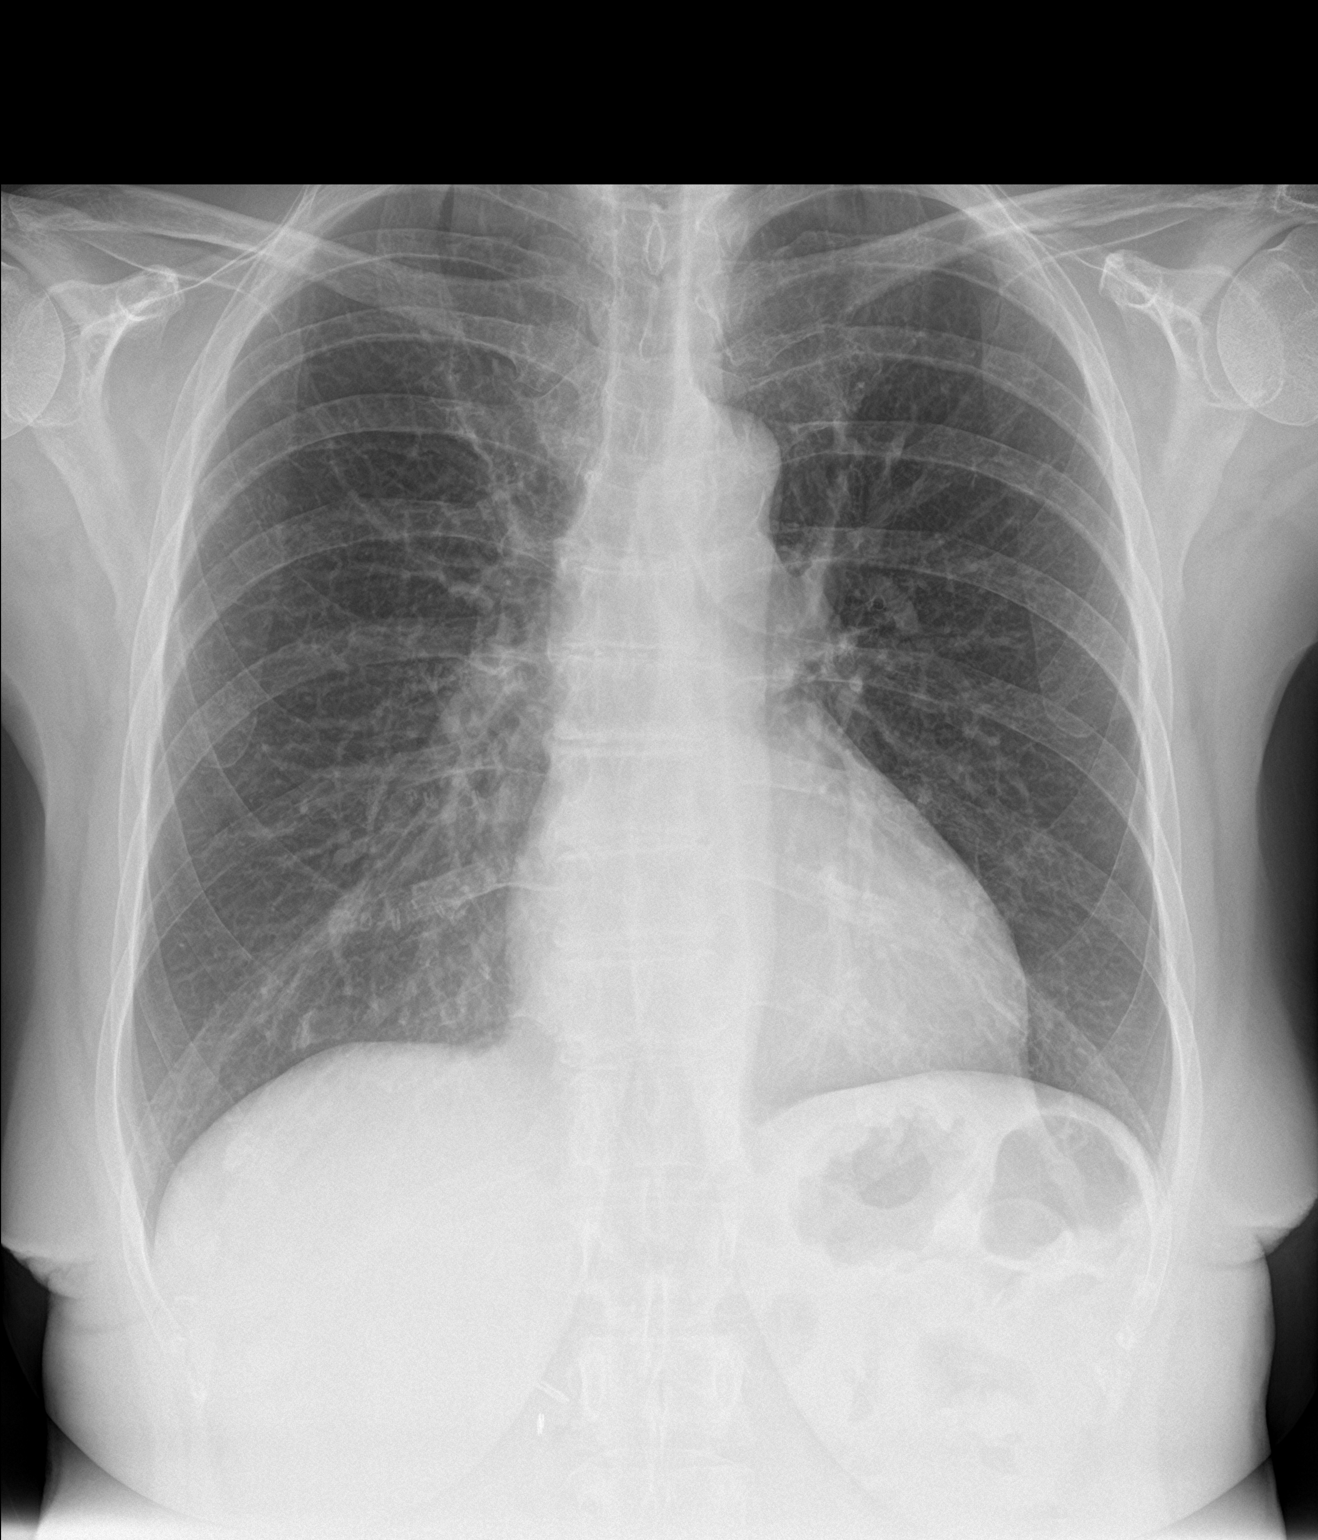

[chest lat]
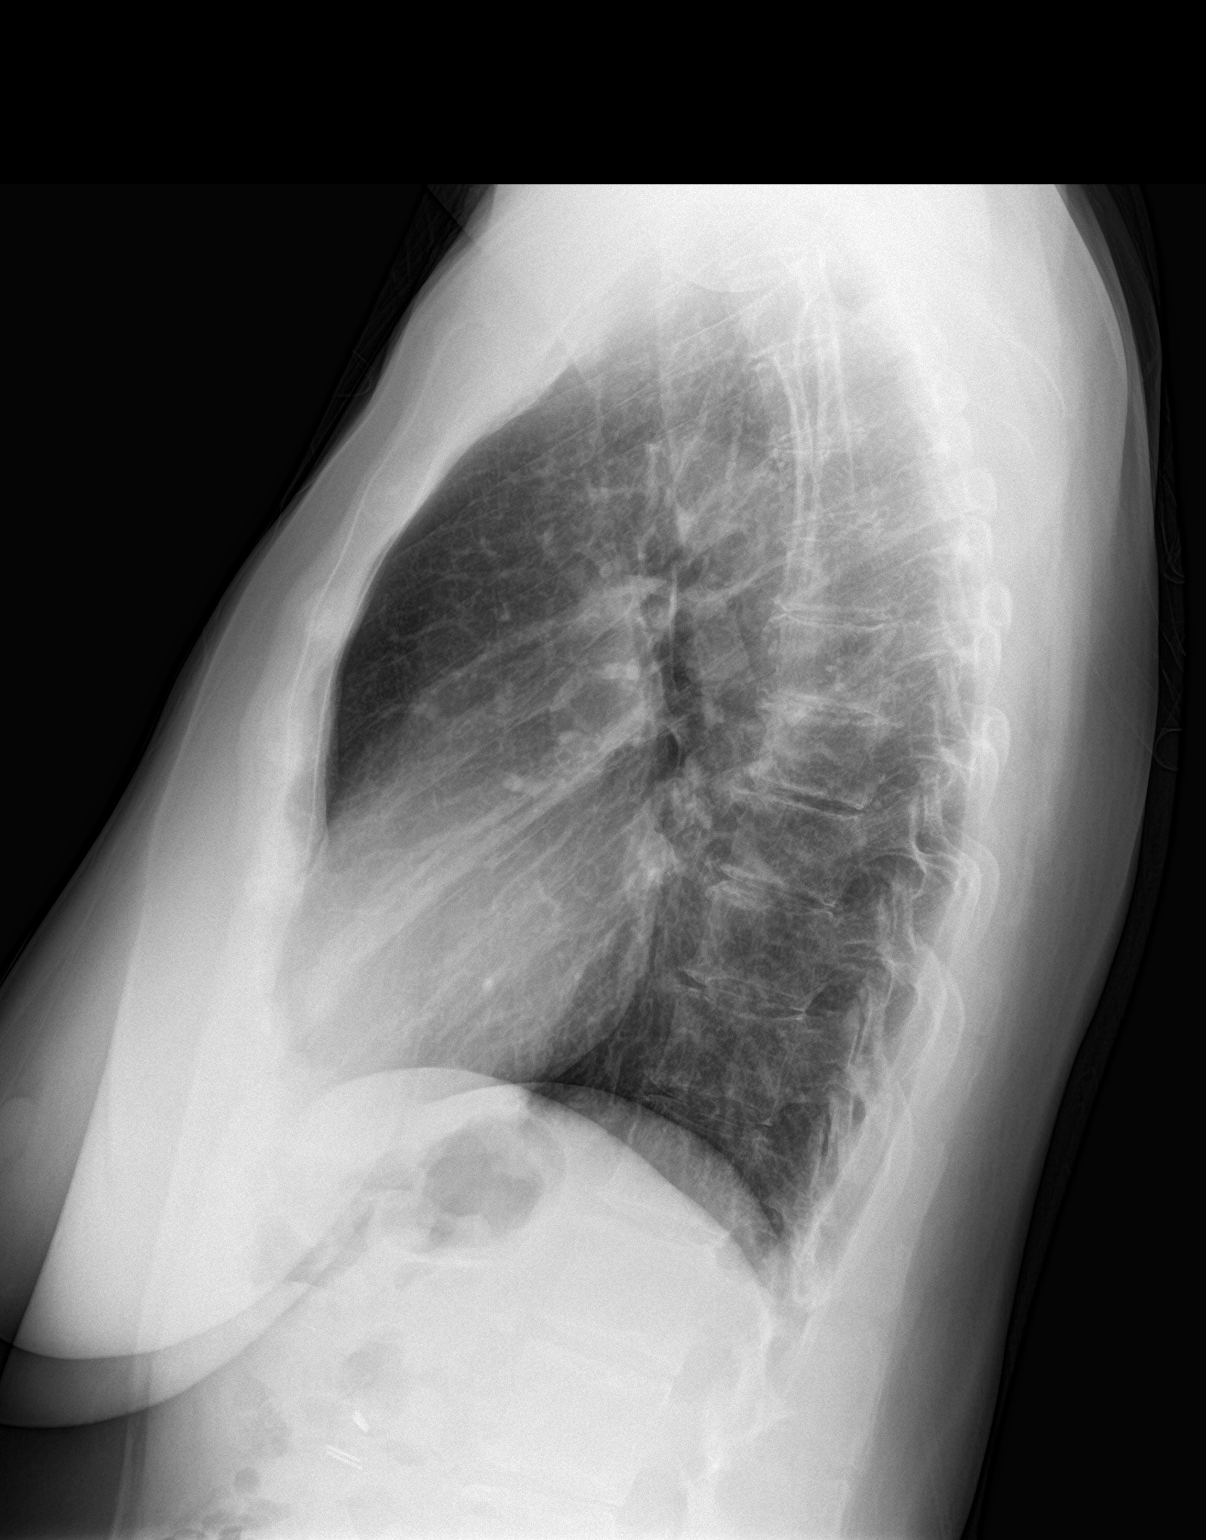

[2 of 2 positions shown; findings below may reference images not displayed]

FINDINGS: The lungs are well-expanded. The interstitial markings are coarse
bilaterally. There is no alveolar infiltrate or pleural effusion. No
pulmonary parenchymal nodules or masses are observed. The heart and
pulmonary vascularity are normal. The trachea is midline. The bony
thorax exhibits no acute abnormality.
IMPRESSION: Chronic bronchitic changes. There is no active cardiopulmonary
disease.

## 2017-03-18 NOTE — Telephone Encounter (Signed)
-   Tried calling pt about scheduling awv - number was disconnected.

## 2017-03-20 DIAGNOSIS — H40213 Acute angle-closure glaucoma, bilateral: Secondary | ICD-10-CM | POA: Diagnosis not present

## 2017-03-20 DIAGNOSIS — Z1231 Encounter for screening mammogram for malignant neoplasm of breast: Secondary | ICD-10-CM | POA: Diagnosis not present

## 2017-03-20 DIAGNOSIS — E782 Mixed hyperlipidemia: Secondary | ICD-10-CM | POA: Diagnosis not present

## 2017-03-20 DIAGNOSIS — E034 Atrophy of thyroid (acquired): Secondary | ICD-10-CM | POA: Diagnosis not present

## 2017-04-03 DIAGNOSIS — Z1231 Encounter for screening mammogram for malignant neoplasm of breast: Secondary | ICD-10-CM | POA: Diagnosis not present

## 2017-04-23 DIAGNOSIS — M25551 Pain in right hip: Secondary | ICD-10-CM | POA: Diagnosis not present

## 2017-04-23 DIAGNOSIS — Z96641 Presence of right artificial hip joint: Secondary | ICD-10-CM | POA: Diagnosis not present

## 2017-04-23 DIAGNOSIS — Z96642 Presence of left artificial hip joint: Secondary | ICD-10-CM | POA: Diagnosis not present

## 2017-04-23 DIAGNOSIS — M25552 Pain in left hip: Secondary | ICD-10-CM | POA: Diagnosis not present

## 2017-08-26 DIAGNOSIS — H401131 Primary open-angle glaucoma, bilateral, mild stage: Secondary | ICD-10-CM | POA: Diagnosis not present

## 2017-09-22 DIAGNOSIS — R351 Nocturia: Secondary | ICD-10-CM | POA: Diagnosis not present

## 2017-09-22 DIAGNOSIS — R238 Other skin changes: Secondary | ICD-10-CM | POA: Diagnosis not present

## 2017-09-22 DIAGNOSIS — M8580 Other specified disorders of bone density and structure, unspecified site: Secondary | ICD-10-CM | POA: Diagnosis not present

## 2017-09-22 DIAGNOSIS — H409 Unspecified glaucoma: Secondary | ICD-10-CM | POA: Diagnosis not present

## 2017-09-22 DIAGNOSIS — R5383 Other fatigue: Secondary | ICD-10-CM | POA: Diagnosis not present

## 2017-09-22 DIAGNOSIS — E039 Hypothyroidism, unspecified: Secondary | ICD-10-CM | POA: Diagnosis not present

## 2017-09-22 DIAGNOSIS — E782 Mixed hyperlipidemia: Secondary | ICD-10-CM | POA: Diagnosis not present

## 2017-09-22 DIAGNOSIS — E034 Atrophy of thyroid (acquired): Secondary | ICD-10-CM | POA: Diagnosis not present

## 2017-10-03 DIAGNOSIS — M8588 Other specified disorders of bone density and structure, other site: Secondary | ICD-10-CM | POA: Diagnosis not present

## 2017-10-03 DIAGNOSIS — Z780 Asymptomatic menopausal state: Secondary | ICD-10-CM | POA: Diagnosis not present

## 2018-04-16 DIAGNOSIS — Z91040 Latex allergy status: Secondary | ICD-10-CM | POA: Diagnosis not present

## 2018-04-16 DIAGNOSIS — S73005A Unspecified dislocation of left hip, initial encounter: Secondary | ICD-10-CM | POA: Diagnosis not present

## 2018-04-16 DIAGNOSIS — Z880 Allergy status to penicillin: Secondary | ICD-10-CM | POA: Diagnosis not present

## 2018-04-16 DIAGNOSIS — Z79899 Other long term (current) drug therapy: Secondary | ICD-10-CM | POA: Diagnosis not present

## 2018-04-16 DIAGNOSIS — T84021A Dislocation of internal left hip prosthesis, initial encounter: Secondary | ICD-10-CM | POA: Diagnosis not present

## 2018-04-16 DIAGNOSIS — M25559 Pain in unspecified hip: Secondary | ICD-10-CM | POA: Diagnosis not present

## 2018-04-16 DIAGNOSIS — R531 Weakness: Secondary | ICD-10-CM | POA: Diagnosis not present

## 2018-04-16 DIAGNOSIS — E039 Hypothyroidism, unspecified: Secondary | ICD-10-CM | POA: Diagnosis not present

## 2018-04-16 DIAGNOSIS — M25552 Pain in left hip: Secondary | ICD-10-CM | POA: Diagnosis not present

## 2018-04-16 DIAGNOSIS — Z96642 Presence of left artificial hip joint: Secondary | ICD-10-CM | POA: Diagnosis not present

## 2018-04-16 DIAGNOSIS — Z888 Allergy status to other drugs, medicaments and biological substances status: Secondary | ICD-10-CM | POA: Diagnosis not present

## 2018-04-24 DIAGNOSIS — M25552 Pain in left hip: Secondary | ICD-10-CM | POA: Diagnosis not present

## 2018-12-21 DIAGNOSIS — Z1231 Encounter for screening mammogram for malignant neoplasm of breast: Secondary | ICD-10-CM | POA: Diagnosis not present

## 2019-10-04 DIAGNOSIS — R351 Nocturia: Secondary | ICD-10-CM | POA: Diagnosis not present

## 2019-10-04 DIAGNOSIS — M85859 Other specified disorders of bone density and structure, unspecified thigh: Secondary | ICD-10-CM | POA: Diagnosis not present

## 2019-10-04 DIAGNOSIS — Z0000 Encounter for general adult medical examination without abnormal findings: Secondary | ICD-10-CM | POA: Diagnosis not present

## 2019-10-04 DIAGNOSIS — Z131 Encounter for screening for diabetes mellitus: Secondary | ICD-10-CM | POA: Diagnosis not present

## 2019-10-04 DIAGNOSIS — R5383 Other fatigue: Secondary | ICD-10-CM | POA: Diagnosis not present

## 2019-10-04 DIAGNOSIS — E034 Atrophy of thyroid (acquired): Secondary | ICD-10-CM | POA: Diagnosis not present

## 2019-10-04 DIAGNOSIS — E782 Mixed hyperlipidemia: Secondary | ICD-10-CM | POA: Diagnosis not present

## 2019-10-04 DIAGNOSIS — R490 Dysphonia: Secondary | ICD-10-CM | POA: Diagnosis not present
